# Patient Record
Sex: Male | Born: 1990 | Race: White | Hispanic: No | Marital: Single | Smoking: Former smoker
Health system: Southern US, Community
[De-identification: ages and names within clinical notes are randomized; demographics above are authoritative.]

## PROBLEM LIST (undated history)

## (undated) DIAGNOSIS — J45909 Unspecified asthma, uncomplicated: Secondary | ICD-10-CM

## (undated) HISTORY — DX: Unspecified asthma, uncomplicated: J45.909

---
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
## 2010-01-01 ENCOUNTER — Ambulatory Visit: Payer: Self-pay | Admitting: Internal Medicine

## 2010-01-01 DIAGNOSIS — J309 Allergic rhinitis, unspecified: Secondary | ICD-10-CM | POA: Insufficient documentation

## 2010-01-01 DIAGNOSIS — R220 Localized swelling, mass and lump, head: Secondary | ICD-10-CM | POA: Insufficient documentation

## 2010-01-01 DIAGNOSIS — R221 Localized swelling, mass and lump, neck: Secondary | ICD-10-CM

## 2010-01-01 DIAGNOSIS — Q892 Congenital malformations of other endocrine glands: Secondary | ICD-10-CM | POA: Insufficient documentation

## 2010-01-01 DIAGNOSIS — J45909 Unspecified asthma, uncomplicated: Secondary | ICD-10-CM | POA: Insufficient documentation

## 2010-04-07 ENCOUNTER — Telehealth: Payer: Self-pay | Admitting: Cardiology

## 2010-04-23 ENCOUNTER — Ambulatory Visit: Payer: Self-pay | Admitting: Cardiovascular Disease

## 2010-04-23 ENCOUNTER — Ambulatory Visit (HOSPITAL_COMMUNITY): Admission: RE | Admit: 2010-04-23 | Discharge: 2010-04-23 | Payer: Self-pay | Admitting: Cardiology

## 2010-04-23 ENCOUNTER — Encounter: Payer: Self-pay | Admitting: Cardiology

## 2010-04-23 ENCOUNTER — Ambulatory Visit: Payer: Self-pay

## 2010-04-27 ENCOUNTER — Encounter: Payer: Self-pay | Admitting: Cardiology

## 2010-04-28 ENCOUNTER — Encounter: Admission: RE | Admit: 2010-04-28 | Discharge: 2010-04-28 | Payer: Self-pay | Admitting: Oral Surgery

## 2010-04-29 ENCOUNTER — Encounter: Payer: Self-pay | Admitting: Cardiology

## 2010-04-30 ENCOUNTER — Ambulatory Visit: Payer: Self-pay | Admitting: Cardiology

## 2010-04-30 ENCOUNTER — Telehealth: Payer: Self-pay | Admitting: Cardiology

## 2010-05-20 ENCOUNTER — Ambulatory Visit: Payer: Self-pay | Admitting: Internal Medicine

## 2010-05-20 DIAGNOSIS — R042 Hemoptysis: Secondary | ICD-10-CM | POA: Insufficient documentation

## 2010-05-20 DIAGNOSIS — R9389 Abnormal findings on diagnostic imaging of other specified body structures: Secondary | ICD-10-CM | POA: Insufficient documentation

## 2010-05-21 ENCOUNTER — Encounter (INDEPENDENT_AMBULATORY_CARE_PROVIDER_SITE_OTHER): Payer: Self-pay | Admitting: *Deleted

## 2010-05-25 ENCOUNTER — Encounter: Payer: Self-pay | Admitting: Internal Medicine

## 2010-05-26 ENCOUNTER — Ambulatory Visit (HOSPITAL_COMMUNITY): Admission: RE | Admit: 2010-05-26 | Discharge: 2010-05-26 | Payer: Self-pay | Admitting: Internal Medicine

## 2010-05-26 ENCOUNTER — Ambulatory Visit: Payer: Self-pay | Admitting: Cardiology

## 2010-12-28 NOTE — Progress Notes (Signed)
Summary: clearance for surgery on 6/7   Phone Note From Other Clinic   Caller: kristen office [phone] Request: Talk with Nurse Summary of Call: from office notes pt need to be clear surgery on 6/7. pls forward ekg over as well.  Initial call taken by: Lorne Skeens,  April 30, 2010 3:40 PM  Follow-up for Phone Call        needs an ok for pt to have anesthesia faxed to [phone], willhave Dr Myrtis Ser include in his ov note and fax Meredith Staggers, RN  April 30, 2010 4:09 PM   NOTE FAXED Meredith Staggers, RN  April 30, 2010 5:25 PM

## 2010-12-28 NOTE — Assessment & Plan Note (Signed)
Summary: low ef   Visit Type:  Initial Consult Referring Provider:  Bensimhon Primary Provider:  Marga Melnick MD  CC:  no complaints -- just 2 weeks out of jaw surgery.  History of Present Illness: Bruce Foster is a 20 y/o male referred by Dr. Myrtis Ser for the evaluation of an abnormal echocardiogram.   He is very healthy.  He has just finished his first year of college.  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  He exercises regularly.  He has no chest pain or shortness of breath. He has not had syncope or presyncope.  He was scheduled to have surgery on his jaw.  As his mother had some cardiac complications when she gave birth and was told to have her kids echo'd at some point, they thus checked a pre-op  echo was ordered as baseline information by Dr.Hopper.   Dr. Myrtis Ser reviewed the echo with me. The EF is 50-55%  The right ventricle is somewhat dilated.  There may be mild decrease in right ventricular function.  In many views it appears that there are mild trabeculae in both the right ventricle and left ventricle may be prominent. Did not appear to be non-compaction.   Current Medications (verified): 1)  Ventolin Hfa 108 (90 Base) Mcg/act Aers (Albuterol Sulfate) .... 2 Puffs Every 4 Hours As Needed For Asthma 2)  Zyrtec Allergy 10 Mg Caps (Cetirizine Hcl) .Marland Kitchen.. 1 Tab Once Daily  Allergies (verified): 1)  ! Pcn 2)  ! * Sporins 3)  ! * Shellfish Allergy  Past History:  Past Medical History: Last updated: 04/30/2010 PCN, Cephalosporin  & sulfa allergies(? angioedema) Allergic rhinitis Asthma EF 45%-50%.... echo... Apr 23, 2010 (with review, EF 50-55%)..prominent trabeculae in the LV and RV Right ventricle... mild to moderate dilatation... echo... May 27... 2011..mild RV dysfunction Jaw surgery.... scheduled.. June, 2011  Past Surgical History: Last updated: 01/01/2010 Thyroglossal Duct Cystectomy as infant    Family History: Last updated: 05/20/2010 Father:  LIVING Mother: LIVING-CAD / MI, Hx of Skin Cancer Siblings:1 sister  MGF : blindness  ? Macular Degeneration  Social History: Last updated: 01/01/2010 Occupation: Student @ [employer] Single Current Smoker: recreational Alcohol use-yes: socially Regular exercise-yes  Risk Factors: Exercise: yes (01/01/2010)  Risk Factors: Smoking Status: current (01/01/2010)  Family History: Reviewed history from 01/01/2010 and no changes required. Father: LIVING Mother: LIVING-CAD / MI, Hx of Skin Cancer Siblings:1 sister  MGF : blindness  ? Macular Degeneration  Social History: Reviewed history from 01/01/2010 and no changes required. 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   Vital Signs:  Patient profile:   20 year old male Height:      71 inches Weight:      140 pounds BMI:     19.60 Pulse rate:   75 / minute BP sitting:   104 / 66  (left arm) Cuff size:   regular  Vitals Entered By: Hardin Negus, RMA (May 20, 2010 11:57 AM)  Physical Exam  General:  Young healthy well appearing. no resp difficulty HEENT: normal except for recent jaw surgery Neck: supple. no JVD. Carotids 2+ bilat; no bruits. No lymphadenopathy or thryomegaly appreciated. Cor: PMI nondisplaced. Bradycardic. regular rhythm. No rubs, gallops, murmur. Lungs: clear Abdomen: soft, nontender, nondistended. No hepatosplenomegaly. No bruits or masses. Good bowel sounds. Extremities: no cyanosis, clubbing,  rash, edema Neuro: alert & orientedx3, cranial nerves grossly intact. moves all 4 extremities w/o difficulty. affect pleasant    Impression & Recommendations:  Problem # 1:  ABNORMAL ECHOCARDIOGRAM (ICD-793.2) I suspect this is just a normal variant of athlete's heart. Will check cardiac MRI to further evaluate trabeculae and quantify EF. If  abnormal will need ETT and monitor.   Other Orders: Cardiac MRI (Cardiac MRI)  Patient Instructions: 1)  Your physician has requested that you have a cardiac MRI.  Cardiac MRI uses a computer to create images of your heart as it's beating, producing both still and moving pictures of your heart and major blood vessels. For further information please visit  [email or web address]  Please follow the instruction sheet given to you today for more information.

## 2010-12-28 NOTE — Progress Notes (Signed)
Summary: new pt appt  Phone Note Call from Patient Call back at Home Phone [phone]   Caller: Mom Reason for Call: Talk to Nurse Summary of Call: request Dr Myrtis Ser take him as a new pt, due to mother health hx, request appt asap, having jaw surgery in June, request to be seen before then Initial call taken by: Migdalia Dk,  Apr 07, 2010 5:04 PM  Follow-up for Phone Call        [facility] Dr Myrtis Ser will see this pt at 1st available, which looks like 5/30 thanks Meredith Staggers, RN  Apr 08, 2010 8:42 AM   lm on vm to call back and sch appt, Migdalia Dk  Apr 08, 2010 11:18 AM   pt is sch for 5/31, mother request pt have  echo prior to appt or before surgery on 6/7, Migdalia Dk  Apr 08, 2010 4:08 PM   Additional Follow-up for Phone Call Additional follow up Details #1::        Dr Myrtis Ser he is sch to see you 5/31 do you want him to have an echo or just wait until you see him??? Meredith Staggers, RN  Apr 14, 2010 2:48 PM     Additional Follow-up for Phone Call Additional follow up Details #2::    Echo ahead of time will be good.  Talitha Givens, MD, [facility]  Apr 15, 2010 10:30 AM  order for echo placed Meredith Staggers, RN  Apr 15, 2010 4:42 PM

## 2010-12-28 NOTE — Letter (Signed)
Summary: [employer]   [employer]   Imported By: Roderic Ovens 07/01/2010 14:52:24  _____________________________________________________________________  External Attachment:    Type:   Image     Comment:   External Document

## 2010-12-28 NOTE — Assessment & Plan Note (Signed)
Summary: np6   Visit Type:  Initial Consult Referring Provider:  Bensimhon Primary Provider:  Marga Melnick MD  CC:  abnormal echocardiogram.  History of Present Illness: The patient is referred for the evaluation of an abnormal echocardiogram.  Patient is quite healthy.  He has just finished his first year of college.  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  He exercises regularly.  He has no chest pain or shortness of breath.  He appears to be in excellent physical condition.  He has not had syncope or presyncope.  He is scheduled to have a long surgery on his jaw.  An echo was ordered as baseline information by Dr.Hopper. The patient was referred to fully discuss and evaluate the echo data.    I have personally reviewed the echo.  There are several beats that give  the impression that there may be some left ventricular dysfunction.  But, as I have reviewed the images repeatedly I believe that the ejection fraction is 50% or possibly as high as 55%.  This leaves the range at 50-55%.  The original reading was 45-50%.  The right ventricle is somewhat dilated.  There may be mild decrease in right ventricular function.  In many views it appears that the trabeculae in both the right ventricle and left ventricle may be prominent.  Current Medications (verified): 1)  Ventolin Hfa 108 (90 Base) Mcg/act Aers (Albuterol Sulfate) .... 2 Puffs Every 4 Hours As Needed For Asthma 2)  Zyrtec Allergy 10 Mg Caps (Cetirizine Hcl) .Marland Kitchen.. 1 Tab Once Daily  Allergies (verified): 1)  ! Pcn 2)  ! * Sporins 3)  ! * Shellfish Allergy  Past History:  Past Medical History: PCN, Cephalosporin  & sulfa allergies(? angioedema) Allergic rhinitis Asthma EF 45%-50%.... echo... Apr 23, 2010 (with review, EF 50-55%)..prominent trabeculae in the LV and RV Right ventricle... mild to moderate dilatation... echo... May 27... 2011..mild RV dysfunction Jaw surgery.... scheduled.. June, 2011  Review of  Systems       The patient denies fever, chills, headache, sweats, rash, change in vision, change in hearing, chest pain, cough, nausea vomiting, urinary symptoms.  All other systems are reviewed and are negative.  Vital Signs:  Patient profile:   20 year old male Height:      71 inches Weight:      150 pounds BMI:     21.00 Pulse rate:   50 / minute BP sitting:   112 / 65  (left arm)  Vitals Entered By: Burnett Kanaris, CNA (April 30, 2010 11:52 AM)  Physical Exam  General:  The patient is extremely healthy in appearance. Head:  head is atraumatic. Eyes:  no xanthelasma. Neck:  no jugular venous stent.  No carotid bruits. Chest Wall:  no chest wall tenderness. Lungs:  lungs are clear.  Respiratory effort is nonlabored. Heart:  cardiac exam reveals S1 and S2.  No clicks or significant murmurs. Abdomen:  abdomen is soft. Msk:  no musculoskeletal deformities. Extremities:  no peripheral edema. Skin:  there is a skin rash on the patient's right knee. Psych:  patient is oriented to person time and place.  Affect is normal.   Impression & Recommendations:  Problem # 1:  * RIGHT VENTRICULAR DILATATION I have reviewed the echo and there is some dilatation of the right ventricle.  There is prominent trabeculae in the right ventricle.  There is no significant tricuspid regurgitation.  Right heart pressure cannot be estimated well from  this study  Problem # 2:  * EF 45-50% (review 50-55%) There is slight  reduction in the ejection fraction. There are prominent trabeculae in the left ventricle.  These are not isolated to the apex.  EKG is done today and reviewed by me.  Patient has sinus bradycardia.  Axis is rightward.  I had a long and careful discussion with the patient about mild decrease in left ventricular function.  The etiology is not clear to me.  It is possible that all of the findings may be related to an athletic heart.  However  he is not currently exercising at a high level.  I've  chosen to refer him to Dr.Bensimhon for further assessment of all of the data.  I considered a cardiac MRI but chosen not to proceed with this until the patient is seen by Dr.Bensimhon.   Problem # 3:  ATHEROSCLEROTIC HEART DISEASE, FAMILY HX (ICD-V17.3) There is a family history of coronary disease.  There is no evidence of cardiac ischemia at this time.  Problem # 4:  ASTHMA (ICD-493.90)  His updated medication list for this problem includes:    Ventolin Hfa 108 (90 Base) Mcg/act Aers (Albuterol sulfate) .Marland Kitchen... 2 puffs every 4 hours as needed for asthma I do not think that the patient's asthma is playing a significant role at this time.  Problem # 5:  * PROMINENT TRABECULAE LV AND RV It is possible that this is a normal variant.  MRI may be appropriate to help with this question.  Overall the patient's cardiac status is stable.  He is clear for his jaw surgery and to go about his normal activities.  Problem # 6:  * CLEARANCE FOR JAW SURGERY Patient is to have jaw surgery.  He is cleared for this.  He is cleared to have general anesthesia for his surgery.  Patient Instructions: 1)  Your physician recommends that you schedule a follow-up appointment in: we will call you with an appointment date

## 2010-12-28 NOTE — Assessment & Plan Note (Signed)
Summary: Bruce Foster   Vital Signs:  Patient profile:   20 year old male Height:      71.25 inches Weight:      142 pounds BMI:     19.74 Temp:     97.6 degrees F oral Pulse rate:   72 / minute Resp:     14 per minute BP sitting:   110 / 68  (left arm) Cuff size:   large  Vitals Entered By: Shonna Chock (January 01, 2010 2:10 PM)  CC: New Patient establish: CPX and swollen lymph nodes on left side x couple weeks, General Medical Evaluation Comments REVIEWED MED LIST, PATIENT AGREED DOSE AND INSTRUCTION CORRECT    CC:  New Patient establish: CPX and swollen lymph nodes on left side x couple weeks and General Medical Evaluation.  History of Present Illness: Bruce Foster is here for an exam; he is concerned about a stable,painless  L neck  lymph node present  ? 3-4 weeks w/o specific trigger. He is to have elective Orthognathic Surgery in 03/2010.  Preventive Screening-Counseling & Management  Alcohol-Tobacco     Smoking Status: current  Caffeine-Diet-Exercise     Does Patient Exercise: yes  Allergies (verified): 1)  ! Pcn  Past History:  Past Medical History: PCN, Cephalosporin  & sulfa allergies(? angioedema) Allergic rhinitis Asthma  Past Surgical History: Thyroglossal Duct Cystectomy as infant    Family History: Father: LIVING Mother: LIVING-CAD / MI, Hx of Skin Cancer Siblings:1 sister  MGF : blindness  ? Macular Degeneration  Social History: Occupation: TEFL teacher Current Smoker: recreational Alcohol use-yes: socially Regular exercise-yes Smoking Status:  current Does Patient Exercise:  yes  Review of Systems General:  Denies chills, fatigue, fever, loss of appetite, malaise, sleep disorder, sweats, and weight loss. Eyes:  Denies blurring, double vision, and vision loss-both eyes. ENT:  Complains of nasal congestion and sinus pressure; denies difficulty swallowing, earache, and hoarseness; Perennial symptoms.No frontal headache , facial pain or  purulence. CV:  Denies chest pain or discomfort, palpitations, and shortness of breath with exertion. Resp:  Denies cough, shortness of breath, and sputum productive; Minor wheezing; rescue used rarely. GI:  Denies abdominal pain, constipation, diarrhea, indigestion, nausea, vomiting, and yellowish skin color. GU:  Denies discharge, dysuria, genital sores, and hematuria. MS:  Denies joint pain, joint redness, and joint swelling. Derm:  Denies changes in color of skin, lesion(s), and rash. Neuro:  Denies numbness and tingling. Psych:  Denies anxiety and depression. Endo:  Denies cold intolerance and heat intolerance. Heme:  Complains of enlarge lymph nodes; denies abnormal bruising, bleeding, and pallor. Allergy:  Denies itching eyes and sneezing.  Physical Exam  General:  Thin but well-nourished; alert,appropriate and cooperative throughout examination Head:  Normocephalic and atraumatic without obvious abnormalities. No apparent alopecia or balding. Eyes:  No corneal or conjunctival inflammation noted.Marland Kitchen Perrla. Funduscopic exam benign, without hemorrhages, exudates or papilledema. No lid lag Ears:  External ear exam shows no significant lesions or deformities.  Otoscopic examination reveals clear canals, tympanic membranes are intact bilaterally without bulging, retraction, inflammation or discharge. Hearing is grossly normal bilaterally. Nose:  External nasal examination shows no deformity or inflammation. Nasal mucosa  mildly erythematous with minimal  exudate Mouth:  Oral mucosa and oropharynx without lesions or exudates.  Teeth in good repair. Upper & lower braces. Relative mandibular micrognathia Neck:  No deformities, masses, or tenderness noted.? prominent external venous valve over well developed L SCM musculature Chest Wall:  Slight asymmetry  of pectoralis musculature Lungs:  Normal respiratory effort, chest expands symmetrically. Lungs are clear to auscultation, no crackles or  wheezes. Heart:  Normal rate and regular rhythm. S1 and S2 normal without gallop, murmur, click, rub. Loud S4 Abdomen:  Bowel sounds positive,abdomen soft and non-tender without masses, organomegaly or hernias noted. Rectal:  Deferred Genitalia:  Testes bilaterally descended without nodularity, tenderness or masses. No scrotal masses or lesions. No penis lesions or urethral discharge. Prostate:  deferred Msk:  ? minimal scoliosis suggested Pulses:  R and L carotid,radial,dorsalis pedis and posterior tibial pulses are full and equal bilaterally Extremities:  No clubbing, cyanosis, edema, or deformity noted with normal full range of motion of all joints.   Neurologic:  alert & oriented X3 and DTRs symmetrical and normal.   Skin:  Facial plethora Cervical Nodes:  No lymphadenopathy noted (see neck) Axillary Nodes:  No palpable lymphadenopathy Inguinal Nodes:  No significant adenopathy Psych:  memory intact for recent and remote, normally interactive, and good eye contact.     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 Neti pot once daily for any head congestion.Consider baseline fasting labs:BMP ;Hepatic Panel ;Lipid Panel ;TSH ;CBC w/ Diff. Report any changes in neck lesion or symptoms.

## 2010-12-28 NOTE — Letter (Signed)
Summary: Appointment - Cardiac MRI  [facility]     [place], [place]    Phone:   Fax:       May 21, 2010 MRN: [id number]   Bruce Foster [address] [place], [place]  [address]   Dear [name],   We have scheduled the above patient for an appointment for a Cardiac MRI on June 29,2011 at 8:00a.m.  Please refer to the below information for the location and instructions for this test:  Location:     [facility]       [address]       [place], [place]  [address] Instructions:    Wilmon Arms at [facility] Outpatient Registration 45 minutes prior to your appointment time.  This will ensure you are in the Radiology Department 30 minutes prior to your appointment.    There are no restrictions for this test you may eat and take medications as usual.  If you need to reschedule this appointment please call at the number listed above.   Sincerely,      Lorne Skeens  [facility] Scheduling Team

## 2010-12-28 NOTE — Miscellaneous (Signed)
  Clinical Lists Changes  Observations: Added new observation of PAST MED HX: PCN, Cephalosporin  & sulfa allergies(? angioedema) Allergic rhinitis Asthma EF 45%-50%.... echo... Apr 23, 2010 Right ventricle... mild to moderate dilatation... echo... May 27... 2011..mild RV dysfunction (04/27/2010 9:47)       Past History:  Past Medical History: PCN, Cephalosporin  & sulfa allergies(? angioedema) Allergic rhinitis Asthma EF 45%-50%.... echo... Apr 23, 2010 Right ventricle... mild to moderate dilatation... echo... May 27... 2011..mild RV dysfunction

## 2010-12-28 NOTE — Letter (Signed)
Summary: New Patient Letter  [facility] [place]  [address] [place], [place] [address]   Phone: [phone]  Fax: [phone]       12/16/2009 MRN: [id number]  Bruce Foster [address] [place], [place]  [address]  Dear [name],   Welcome to [facility] and thank you for choosing [place] as your Primary Care Providers. Enclosed you will find information about our practice that we hope you find helpful. We have also enclosed forms to be filled out prior to your visit. This will provide [place] with the necessary information and facilitate your being seen in a timely manner. If you have any questions, please call us at:  [phone]        and we will be happy to assist you. We look forward to seeing you at your scheduled appointment time.  Appointment   [name] [phone] @ 3:30PM            with Dr.    Alwyn Ren             Sincerely,  Primary Health Care Team  Please arrive 15 minutes early for your first appointment and bring your insurance card. Co-pay is required at the time of your visit.  *****Please call the office if you are not able to keep this appointment. There is a charge of $50.00 if any appointment is not cancelled or rescheduled within 24 hours.

## 2010-12-28 NOTE — Miscellaneous (Signed)
  Clinical Lists Changes  Problems: Added new problem of * EF 45-50% Added new problem of * RIGHT VENTRICULAR DILATATION Observations: Added new observation of PAST MED HX: PCN, Cephalosporin  & sulfa allergies(? angioedema) Allergic rhinitis Asthma EF 45%-50%.... echo... Apr 23, 2010 Right ventricle... mild to moderate dilatation... echo... May 27... 2011 (04/23/2010 15:12)       Past History:  Past Medical History: PCN, Cephalosporin  & sulfa allergies(? angioedema) Allergic rhinitis Asthma EF 45%-50%.... echo... Apr 23, 2010 Right ventricle... mild to moderate dilatation... echo... May 27... 2011

## 2010-12-28 NOTE — Miscellaneous (Signed)
  Clinical Lists Changes  Observations: Added new observation of ECHOINTERP:  - Left ventricle: The cavity size was normal. Systolic function was       mildly reduced. The estimated ejection fraction was in the range       of 45% to 50%.     - Right ventricle: The cavity size was mildly to moderately dilated.     - Atrial septum: No defect or patent foramen ovale was identified. (04/23/2010 10:16)      Echocardiogram  Procedure date:  04/23/2010  Findings:       - Left ventricle: The cavity size was normal. Systolic function was       mildly reduced. The estimated ejection fraction was in the range       of 45% to 50%.     - Right ventricle: The cavity size was mildly to moderately dilated.     - Atrial septum: No defect or patent foramen ovale was identified.

## 2011-02-13 LAB — CREATININE, SERUM
Creatinine, Ser: 0.81 mg/dL (ref 0.4–1.5)
GFR calc non Af Amer: >60 mL/min (ref >60)

## 2011-02-13 LAB — BUN: BUN: 14 mg/dL (ref 6–23)

## 2011-05-23 ENCOUNTER — Ambulatory Visit: Payer: Self-pay | Admitting: Internal Medicine

## 2012-01-06 IMAGING — CR DG CHEST 2V
2 series · 2 of 2 positions shown · non-contrast
Comparison: None.

CLINICAL DATA: Preop.

CHEST - 2 VIEW

[view not recorded (1 of 2)]
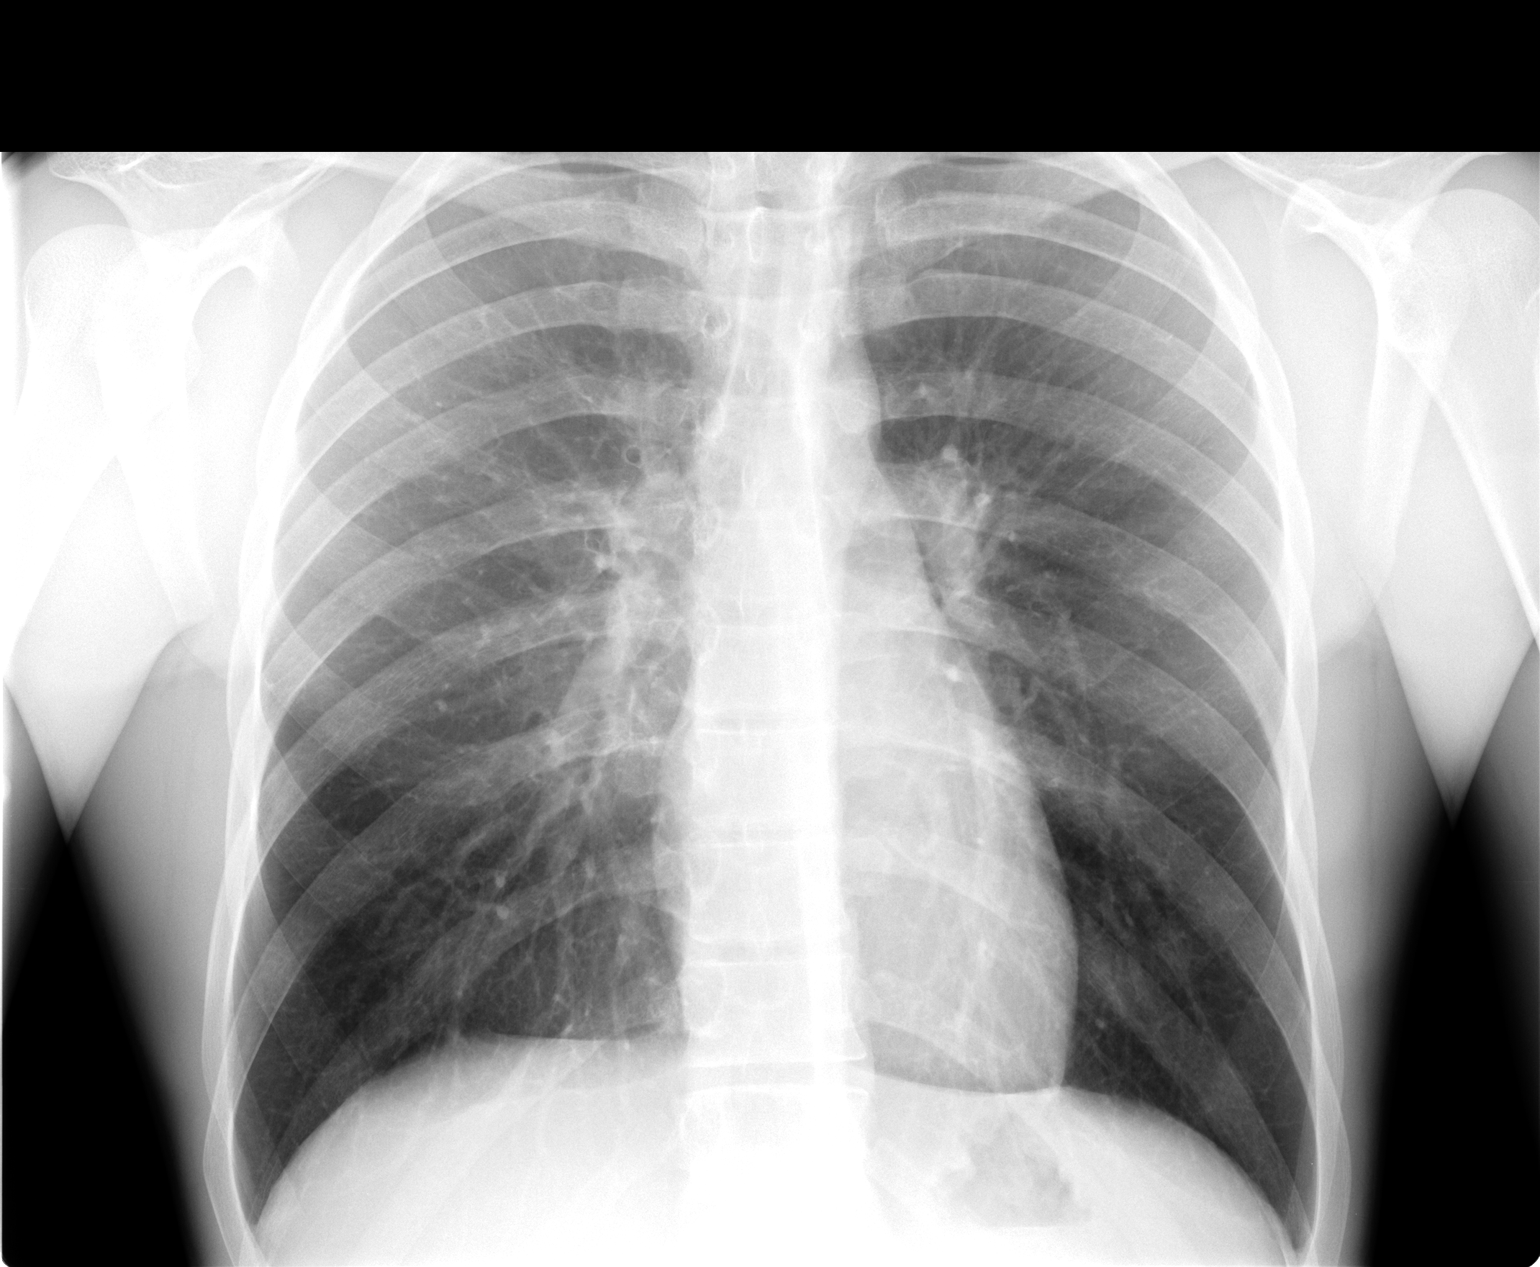

[view not recorded (2 of 2)]
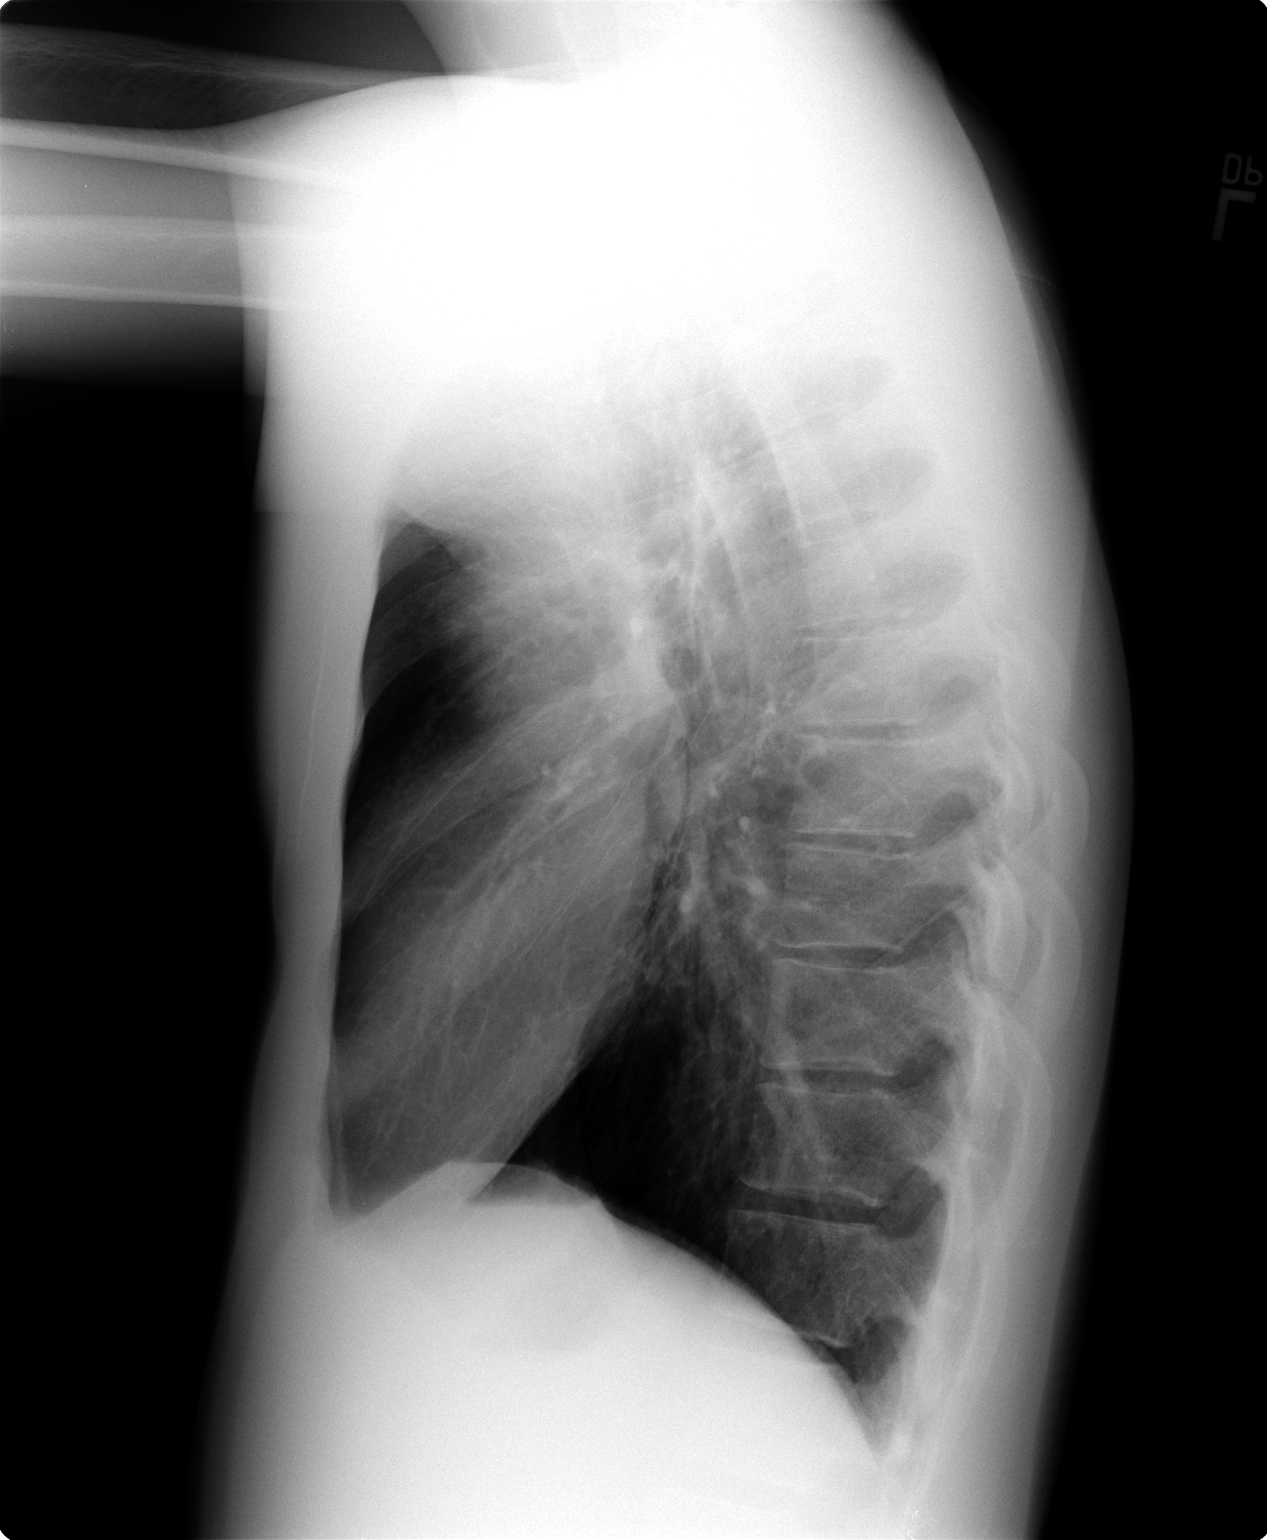

[2 of 2 positions shown; findings below may reference images not displayed]

FINDINGS: Trachea is midline.  Heart size normal.  Lungs are clear.
No pleural fluid.
IMPRESSION: No acute findings.

## 2012-02-03 ENCOUNTER — Other Ambulatory Visit: Payer: Self-pay | Admitting: Internal Medicine

## 2012-02-03 NOTE — Telephone Encounter (Signed)
Ok for 1 inhaler, no refills.  Pt must make appt

## 2012-02-03 NOTE — Telephone Encounter (Signed)
Last and only OV 01/01/2010, please advise.

## 2012-02-03 NOTE — Telephone Encounter (Signed)
Patient needs to schedule a CPX  

## 2012-08-30 ENCOUNTER — Ambulatory Visit: Payer: Self-pay | Admitting: Family Medicine

## 2013-03-19 ENCOUNTER — Ambulatory Visit: Payer: Self-pay | Admitting: Internal Medicine

## 2013-04-12 ENCOUNTER — Encounter: Payer: Self-pay | Admitting: General Practice

## 2013-04-12 ENCOUNTER — Other Ambulatory Visit: Payer: Self-pay | Admitting: Internal Medicine

## 2013-04-12 NOTE — Telephone Encounter (Signed)
Medication denied. Pt has not been seen by anyone in this office since 2011. Pt advised on last refill that he needs a CPE. Letter mailed to pt informing again.

## 2013-05-21 ENCOUNTER — Other Ambulatory Visit (HOSPITAL_BASED_OUTPATIENT_CLINIC_OR_DEPARTMENT_OTHER): Payer: Self-pay | Admitting: Internal Medicine

## 2013-05-21 ENCOUNTER — Ambulatory Visit (HOSPITAL_BASED_OUTPATIENT_CLINIC_OR_DEPARTMENT_OTHER)
Admission: RE | Admit: 2013-05-21 | Discharge: 2013-05-21 | Disposition: A | Discharge status: Home / Self Care | Payer: Self-pay | Source: Ambulatory Visit | Attending: Internal Medicine | Admitting: Internal Medicine

## 2013-05-21 DIAGNOSIS — M545 Low back pain, unspecified: Secondary | ICD-10-CM

## 2013-05-21 DIAGNOSIS — M5410 Radiculopathy, site unspecified: Secondary | ICD-10-CM

## 2014-11-04 ENCOUNTER — Ambulatory Visit: Payer: Self-pay | Admitting: Family

## 2014-11-11 ENCOUNTER — Encounter: Payer: Self-pay | Admitting: Family

## 2014-11-11 ENCOUNTER — Ambulatory Visit (INDEPENDENT_AMBULATORY_CARE_PROVIDER_SITE_OTHER): Payer: Managed Care, Other (non HMO) | Admitting: Family

## 2014-11-11 ENCOUNTER — Telehealth: Payer: Self-pay | Admitting: Family

## 2014-11-11 ENCOUNTER — Other Ambulatory Visit (INDEPENDENT_AMBULATORY_CARE_PROVIDER_SITE_OTHER): Payer: Managed Care, Other (non HMO)

## 2014-11-11 DIAGNOSIS — J392 Other diseases of pharynx: Secondary | ICD-10-CM

## 2014-11-11 DIAGNOSIS — R220 Localized swelling, mass and lump, head: Secondary | ICD-10-CM

## 2014-11-11 DIAGNOSIS — R221 Localized swelling, mass and lump, neck: Secondary | ICD-10-CM

## 2014-11-11 DIAGNOSIS — R0989 Other specified symptoms and signs involving the circulatory and respiratory systems: Secondary | ICD-10-CM | POA: Insufficient documentation

## 2014-11-11 LAB — CBC WITH DIFFERENTIAL/PLATELET
Basophils Absolute: 0 10*3/uL (ref 0.0–0.1)
Basophils Relative: 0.4 % (ref 0.0–3.0)
EOS ABS: 0.3 10*3/uL (ref 0.0–0.7)
EOS PCT: 3.8 % (ref 0.0–5.0)
HEMATOCRIT: 48.2 % (ref 39.0–52.0)
Hemoglobin: 16.4 g/dL (ref 13.0–17.0)
LYMPHS ABS: 2 10*3/uL (ref 0.7–4.0)
Lymphocytes Relative: 23.7 % (ref 12.0–46.0)
MCHC: 34 g/dL (ref 30.0–36.0)
MCV: 94 fl (ref 78.0–100.0)
MONO ABS: 0.7 10*3/uL (ref 0.1–1.0)
Monocytes Relative: 8.8 % (ref 3.0–12.0)
NEUTROS PCT: 63.3 % (ref 43.0–77.0)
Neutro Abs: 5.3 10*3/uL (ref 1.4–7.7)
PLATELETS: 242 10*3/uL (ref 150.0–400.0)
RBC: 5.13 Mil/uL (ref 4.22–5.81)
RDW: 13.3 % (ref 11.5–15.5)
WBC: 8.3 10*3/uL (ref 4.0–10.5)

## 2014-11-11 MED ORDER — EPINEPHRINE 0.3 MG/0.3ML IJ SOAJ: 0.3000 mg | Freq: Once | INTRAMUSCULAR | Status: AC

## 2014-11-11 NOTE — Progress Notes (Signed)
Pre visit review using our clinic review tool, if applicable. No additional management support is needed unless otherwise documented below in the visit note. 

## 2014-11-11 NOTE — Progress Notes (Signed)
Subjective:    Patient ID: Bruce Foster, male    DOB: 03/02/1991, 23 y.o.   MRN: [id number]  Chief Complaint  Patient presents with  . Establish Care    wants a check up    HPI:  Bruce Foster is a 23 y.o. male who presents today    1) Neck - Acute symptoms started about 4-5 months ago and was seen at urgent care and was told that it would probably go away. Lump is located on the left lateral neck. Denies any changes in size. Denies any treatments attempted for it. Denies any changes to swallowing or breathing. Describes no pain. There is nothing that makes it better or worse.  2) Tonsils - Acute symptoms started a couple of days ago and was noted to have a swollen tonsil on the right side. Denies any fevers, sore throat or cold like symptoms. Has tried hot tea which had no effect on it. Denies anything that makes it better or worse.   Allergies  Allergen Reactions  . Cephalosporins   . Penicillins    Current Outpatient Prescriptions on File Prior to Visit  Medication Sig Dispense Refill  . VENTOLIN HFA 108 (90 BASE) MCG/ACT inhaler 2 PUFFS EVERY 4 HOURS IF NEEDED FOR ASTHMA. 8.5 each 0   No current facility-administered medications on file prior to visit.   Past Medical History  Diagnosis Date  . Asthma    Family History  Problem Relation Age of Onset  . Heart disease Mother    History   Social History  . Marital Status: Single    Spouse Name: N/A    Number of Children: 0  . Years of Education: 16   Occupational History  . Unemployed    Social History Main Topics  . Smoking status: Current Some Day Smoker    Types: Cigars  . Smokeless tobacco: Never Used  . Alcohol Use: 12.6 oz/week    0 Not specified, 21 Cans of beer per week  . Drug Use: No  . Sexual Activity: Not on file   Other Topics Concern  . Not on file   Social History Narrative   Born and raised in [place] and then raised mainly in [place]. Currently lives in an apartment by himself. No pets.  Fun: Basketball, running, mountain biking   Denies religious beliefs effecting health care.     Review of Systems    See HPI  Objective:    BP 124/82 mmHg  Pulse 61  Temp(Src) 98.4 F (36.9 C) (Oral)  Resp 18  Ht 5\' 11"  (1.803 m)  Wt 151 lb 6.4 oz (68.675 kg)  BMI 21.13 kg/m2  SpO2 97% Nursing note and vital signs reviewed.  Physical Exam  Constitutional: He is oriented to person, place, and time. He appears well-developed and well-nourished. No distress.  HENT:  Tonsils are 1+ bilaterally with no evidence of exudate present.  Neck:  No obvious discoloration of the neck noted. Mild edema of left lateral neck noted. No tenderness elicited upon palpation. Mobile and soft mass noted on left lateral aspect of neck.  Cardiovascular: Normal rate, regular rhythm, normal heart sounds and intact distal pulses.   Pulmonary/Chest: Effort normal and breath sounds normal.  Lymphadenopathy:    He has no cervical adenopathy.  Neurological: He is alert and oriented to person, place, and time.  Skin: Skin is warm and dry.  Psychiatric: He has a normal mood and affect. His behavior is normal. Judgment and thought  content normal.       Assessment & Plan:

## 2014-11-11 NOTE — Telephone Encounter (Signed)
Please call the patient and inform him that his complete blood count was normal with no evidence of infection. Therefore, we will continue with the ultrasound of his neck.

## 2014-11-11 NOTE — Assessment & Plan Note (Signed)
No obvious cause for tonsil pain. Continue to monitor at this time. Discussed over-the-counter and supportive care measures with the patient. Follow up if symptoms worsen or fail to improve.

## 2014-11-11 NOTE — Patient Instructions (Addendum)
Thank you for choosing [facility].  Summary/Instructions:  Referrals have been made during this visit. You should expect to hear back from our schedulers in about 7-10 days in regards to establishing an appointment with the specialists we discussed.   If your symptoms worsen or fail to improve, please contact our office for further instruction, or in case of emergency go directly to the emergency room at the closest medical facility.     

## 2014-11-11 NOTE — Assessment & Plan Note (Addendum)
Continues to note a mass in left neck. 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 Obtain ultrasound of neck. Obtain CBC to rule out infectious processes. Follow up pending ultrasound results.

## 2014-11-12 NOTE — Telephone Encounter (Signed)
Called and let pt know that all results were normal and we will continue with the ultrasound of his neck

## 2014-11-17 ENCOUNTER — Ambulatory Visit
Admission: RE | Admit: 2014-11-17 | Discharge: 2014-11-17 | Disposition: A | Discharge status: Home / Self Care | Payer: Managed Care, Other (non HMO) | Source: Ambulatory Visit | Attending: Family | Admitting: Family

## 2014-11-17 DIAGNOSIS — R220 Localized swelling, mass and lump, head: Secondary | ICD-10-CM

## 2014-11-17 DIAGNOSIS — R221 Localized swelling, mass and lump, neck: Principal | ICD-10-CM

## 2014-11-18 ENCOUNTER — Telehealth: Payer: Self-pay | Admitting: Family

## 2014-11-18 NOTE — Telephone Encounter (Signed)
Pt aware.

## 2014-11-18 NOTE — Telephone Encounter (Signed)
Please call the patient and inform him that his ultrasound results showed a lesion that does not have a blood supply, and it is located in an area of subcutaneous fat. 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 We will continue to monitor at this time and if you notice any significant changes we can pursue further evaluation.

## 2015-06-30 ENCOUNTER — Telehealth: Payer: Self-pay | Admitting: *Deleted

## 2015-06-30 ENCOUNTER — Other Ambulatory Visit: Payer: Self-pay | Admitting: *Deleted

## 2015-06-30 MED ORDER — ALBUTEROL SULFATE HFA 108 (90 BASE) MCG/ACT IN AERS: INHALATION_SPRAY | RESPIRATORY_TRACT | Status: DC

## 2015-06-30 NOTE — Telephone Encounter (Signed)
Patient request refill for albuterol inhaler he only has 8 puffs left.. [facility] [place] [address] Patient can be reached at [phone]

## 2015-06-30 NOTE — Telephone Encounter (Signed)
Received call pt wanting refill on his albuterol inhaler. Verified pharmacy [facility] [place].../lmb

## 2015-06-30 NOTE — Telephone Encounter (Signed)
I don't see that we have seen this pt or Rxd any medications for him (at least since on EPIC in 2013). I called back and LMOM advising this and suggesting he maybe called the wrong Urgent Care, and that he could call back if he has any further questions.

## 2016-01-16 ENCOUNTER — Emergency Department (HOSPITAL_COMMUNITY)
Admission: EM | Admit: 2016-01-16 | Discharge: 2016-01-17 | Disposition: A | Discharge status: Home / Self Care | Payer: BLUE CROSS/BLUE SHIELD | Attending: Emergency Medicine | Admitting: Emergency Medicine

## 2016-01-16 ENCOUNTER — Encounter (HOSPITAL_COMMUNITY): Payer: Self-pay | Admitting: *Deleted

## 2016-01-16 DIAGNOSIS — Z880 Allergy status to penicillin: Secondary | ICD-10-CM | POA: Diagnosis not present

## 2016-01-16 DIAGNOSIS — Y998 Other external cause status: Secondary | ICD-10-CM | POA: Insufficient documentation

## 2016-01-16 DIAGNOSIS — Y9389 Activity, other specified: Secondary | ICD-10-CM | POA: Insufficient documentation

## 2016-01-16 DIAGNOSIS — F17210 Nicotine dependence, cigarettes, uncomplicated: Secondary | ICD-10-CM | POA: Insufficient documentation

## 2016-01-16 DIAGNOSIS — S61210A Laceration without foreign body of right index finger without damage to nail, initial encounter: Secondary | ICD-10-CM | POA: Diagnosis not present

## 2016-01-16 DIAGNOSIS — W268XXA Contact with other sharp object(s), not elsewhere classified, initial encounter: Secondary | ICD-10-CM | POA: Diagnosis not present

## 2016-01-16 DIAGNOSIS — J45909 Unspecified asthma, uncomplicated: Secondary | ICD-10-CM | POA: Diagnosis not present

## 2016-01-16 DIAGNOSIS — Z23 Encounter for immunization: Secondary | ICD-10-CM | POA: Diagnosis not present

## 2016-01-16 DIAGNOSIS — Y9289 Other specified places as the place of occurrence of the external cause: Secondary | ICD-10-CM | POA: Diagnosis not present

## 2016-01-16 DIAGNOSIS — Z79899 Other long term (current) drug therapy: Secondary | ICD-10-CM | POA: Diagnosis not present

## 2016-01-16 DIAGNOSIS — S61219A Laceration without foreign body of unspecified finger without damage to nail, initial encounter: Secondary | ICD-10-CM

## 2016-01-16 NOTE — ED Notes (Signed)
Pt was throwing a ninja star and it cut his right index finger,  Wound has been cleaned and bleeding is controlled,  Pt has been drinking tonight,

## 2016-01-17 MED ORDER — ONDANSETRON 8 MG PO TBDP
8.0000 mg | ORAL_TABLET | Freq: Once | ORAL | Status: AC
  Administered 2016-01-17: 8 mg via ORAL
  Filled 2016-01-17: qty 1

## 2016-01-17 MED ORDER — TETANUS-DIPHTH-ACELL PERTUSSIS 5-2.5-18.5 LF-MCG/0.5 IM SUSP
0.5000 mL | Freq: Once | INTRAMUSCULAR | Status: AC
  Administered 2016-01-17: 0.5 mL via INTRAMUSCULAR
  Filled 2016-01-17: qty 0.5

## 2016-01-17 MED ORDER — LIDOCAINE HCL 1 % IJ SOLN
INTRAMUSCULAR | Status: AC
  Administered 2016-01-17: 20 mL
  Filled 2016-01-17: qty 20

## 2016-01-17 NOTE — Discharge Instructions (Signed)
Laceration Care, Adult  A laceration is a cut that goes through all layers of the skin. The cut also goes into the tissue that is right under the skin. Some cuts heal on their own. Others need to be closed with stitches (sutures), staples, skin adhesive strips, or wound glue. Taking care of your cut lowers your risk of infection and helps your cut to heal better.  HOW TO TAKE CARE OF YOUR CUT  For stitches or staples:  · Keep the wound clean and dry.  · If you were given a bandage (dressing), you should change it at least one time per day or as told by your doctor. You should also change it if it gets wet or dirty.  · Keep the wound completely dry for the first 24 hours or as told by your doctor. After that time, you may take a shower or a bath. However, make sure that the wound is not soaked in water until after the stitches or staples have been removed.  · Clean the wound one time each day or as told by your doctor:    Wash the wound with soap and water.    Rinse the wound with water until all of the soap comes off.    Pat the wound dry with a clean towel. Do not rub the wound.  · After you clean the wound, put a thin layer of antibiotic ointment on it as told by your doctor. This ointment:    Helps to prevent infection.    Keeps the bandage from sticking to the wound.  · Have your stitches or staples removed as told by your doctor.  If your doctor used skin adhesive strips:   · Keep the wound clean and dry.  · If you were given a bandage, you should change it at least one time per day or as told by your doctor. You should also change it if it gets dirty or wet.  · Do not get the skin adhesive strips wet. You can take a shower or a bath, but be careful to keep the wound dry.  · If the wound gets wet, pat it dry with a clean towel. Do not rub the wound.  · Skin adhesive strips fall off on their own. You can trim the strips as the wound heals. Do not remove any strips that are still stuck to the wound. They will  fall off after a while.  If your doctor used wound glue:  · Try to keep your wound dry, but you may briefly wet it in the shower or bath. Do not soak the wound in water, such as by swimming.  · After you take a shower or a bath, gently pat the wound dry with a clean towel. Do not rub the wound.  · Do not do any activities that will make you really sweaty until the skin glue has fallen off on its own.  · Do not apply liquid, cream, or ointment medicine to your wound while the skin glue is still on.  · If you were given a bandage, you should change it at least one time per day or as told by your doctor. You should also change it if it gets dirty or wet.  · If a bandage is placed over the wound, do not let the tape for the bandage touch the skin glue.  · Do not pick at the glue. The skin glue usually stays on for 5-10 days. Then, it   falls off of the skin.  General Instructions   · To help prevent scarring, make sure to cover your wound with sunscreen whenever you are outside after stitches are removed, after adhesive strips are removed, or when wound glue stays in place and the wound is healed. Make sure to wear a sunscreen of at least 30 SPF.  · Take over-the-counter and prescription medicines only as told by your doctor.  · If you were given antibiotic medicine or ointment, take or apply it as told by your doctor. Do not stop using the antibiotic even if your wound is getting better.  · Do not scratch or pick at the wound.  · Keep all follow-up visits as told by your doctor. This is important.  · Check your wound every day for signs of infection. Watch for:    Redness, swelling, or pain.    Fluid, blood, or pus.  · Raise (elevate) the injured area above the level of your heart while you are sitting or lying down, if possible.  GET HELP IF:  · You got a tetanus shot and you have any of these problems at the injection site:    Swelling.    Very bad pain.    Redness.    Bleeding.  · You have a fever.  · A wound that was  closed breaks open.  · You notice a bad smell coming from your wound or your bandage.  · You notice something coming out of the wound, such as wood or glass.  · Medicine does not help your pain.  · You have more redness, swelling, or pain at the site of your wound.  · You have fluid, blood, or pus coming from your wound.  · You notice a change in the color of your skin near your wound.  · You need to change the bandage often because fluid, blood, or pus is coming from the wound.  · You start to have a new rash.  · You start to have numbness around the wound.  GET HELP RIGHT AWAY IF:  · You have very bad swelling around the wound.  · Your pain suddenly gets worse and is very bad.  · You notice painful lumps near the wound or on skin that is anywhere on your body.  · You have a red streak going away from your wound.  · The wound is on your hand or foot and you cannot move a finger or toe like you usually can.  · The wound is on your hand or foot and you notice that your fingers or toes look pale or bluish.     This information is not intended to replace advice given to you by your health care provider. Make sure you discuss any questions you have with your health care provider.     Document Released: 05/02/2008 Document Revised: 03/31/2015 Document Reviewed: 11/10/2014  Elsevier Interactive Patient Education ©2016 [employer]

## 2016-01-17 NOTE — ED Notes (Signed)
Patient d/c'd self care.  F/U reviewed.  Patient verbalized understanding. 

## 2016-01-17 NOTE — ED Provider Notes (Signed)
CSN: [id number]     Arrival date & time 01/16/16  2257 History   First MD Initiated Contact with Patient 01/16/16 2351     Chief Complaint  Patient presents with  . Extremity Laceration     (Consider location/radiation/quality/duration/timing/severity/associated sxs/prior Treatment) HPI Comments: Laceration to right index finger caused by knife. No other injury. Tetanus is out of date.   The history is provided by the patient. No language interpreter was used.    Past Medical History  Diagnosis Date  . Asthma    Past Surgical History  Procedure Laterality Date  . Double jaw surgery  2010   Family History  Problem Relation Age of Onset  . Heart disease Mother    Social History  Substance Use Topics  . Smoking status: Current Some Day Smoker    Types: Cigars  . Smokeless tobacco: Never Used  . Alcohol Use: 12.6 oz/week    0 Standard drinks or equivalent, 21 Cans of beer per week    Review of Systems  Musculoskeletal:       Right index finger injury.  Skin: Positive for wound.  Neurological: Negative for weakness and numbness.      Allergies  Cephalosporins and Penicillins  Home Medications   Prior to Admission medications   Medication Sig Start Date End Date Taking? Authorizing Provider  albuterol (VENTOLIN HFA) 108 (90 BASE) MCG/ACT inhaler 2 PUFFS EVERY 4 HOURS IF NEEDED FOR ASTHMA. 06/30/15   Veryl Speak, FNP  EPINEPHrine 0.3 mg/0.3 mL IJ SOAJ injection Inject 0.3 mLs (0.3 mg total) into the muscle once. 11/11/14   Veryl Speak, FNP   There were no vitals taken for this visit. Physical Exam  Constitutional: He is oriented to person, place, and time. He appears well-developed and well-nourished.  Neck: Normal range of motion.  Pulmonary/Chest: Effort normal.  Musculoskeletal: Normal range of motion.  FROM all joints of finger. No tendon deficits.   Neurological: He is alert and oriented to person, place, and time.  Skin: Skin is warm and dry.  3  cm laceration palmar index finger overlying MC joint. No tendon exposure.  Psychiatric: He has a normal mood and affect.    ED Course  Procedures (including critical care time) Labs Review Labs Reviewed - No data to display  Imaging Review No results found. I have personally reviewed and evaluated these images and lab results as part of my medical decision-making.   EKG Interpretation None     LACERATION REPAIR Performed by: Elpidio Anis A Authorized by: Elpidio Anis A Consent: Verbal consent obtained. Risks and benefits: risks, benefits and alternatives were discussed Consent given by: patient Patient identity confirmed: provided demographic data Prepped and Draped in normal sterile fashion Wound explored  Laceration Location: right index finger  Laceration Length: 3 cm  No Foreign Bodies seen or palpated  Anesthesia: local infiltration  Local anesthetic: lidocaine 1% w/o epinephrine  Anesthetic total: 2 ml  Irrigation method: syringe Amount of cleaning: standard  Skin closure: 4-0 prolene  Number of sutures: 5  Technique: simple interrupted  Patient tolerance: Patient tolerated the procedure well with no immediate complications.  MDM   Final diagnoses:  None    1. Right index finger laceration  Simple laceration right finger, no tendon involvement.     Elpidio Anis, PA-C 01/17/16 [id number]  April Palumbo, MD 01/17/16 [phone]

## 2016-03-21 ENCOUNTER — Telehealth: Payer: Self-pay | Admitting: Family

## 2016-03-21 NOTE — Telephone Encounter (Signed)
 [facility] - Client TELEPHONE ADVICE RECORD [facility]  Patient Name: Bruce CahillMICHAEL Foster  DOB: 09/06/1991    Initial Comment Caller states he has been having pain right below sternum, to the left. Not sure if it is heart pain.    Nurse Assessment  Nurse: Laural BenesJohnson, RN, Dondra SpryGail Date/Time Bruce Foster(Eastern Time): 03/21/2016 4:37:38 PM  Confirm and document reason for call. If symptomatic, describe symptoms. You must click the next button to save text entered. ---Bruce Foster is having pain right below sternum -- when takes deep breath it hurts and tight -- onset Friday  Has the patient traveled out of the country within the last 30 days? ---No  Does the patient have any new or worsening symptoms? ---Yes  Will a triage be completed? ---Yes  Related visit to physician within the last 2 weeks? ---No  Does the PT have any chronic conditions? (i.e. diabetes, asthma, etc.) ---No  Is this a behavioral health or substance abuse call? ---No     Guidelines    Guideline Title Affirmed Question Affirmed Notes  Chest Pain [1] Chest pain lasting <= 5 minutes AND [2] NO chest pain or cardiac symptoms now (Exceptions: pains lasting a few seconds)    Final Disposition User   See Physician within 24 Hours [place] Valley[place], RN, Dondra SpryGail    Comments  Was attempting to make appt for tomorrow when he advised he will keep his appt on Wednesday -- unbeknownst to Nurse he had an appt. Nurse will document the information provided by him   Referrals  REFERRED TO PCP OFFICE   Disagree/Comply: Comply

## 2016-03-22 NOTE — Telephone Encounter (Signed)
Pt has an appointment for 03/23/16.

## 2016-03-23 ENCOUNTER — Encounter: Payer: Self-pay | Admitting: Family

## 2016-03-23 ENCOUNTER — Ambulatory Visit (INDEPENDENT_AMBULATORY_CARE_PROVIDER_SITE_OTHER)
Admission: RE | Admit: 2016-03-23 | Discharge: 2016-03-23 | Disposition: A | Discharge status: Home / Self Care | Payer: BLUE CROSS/BLUE SHIELD | Source: Ambulatory Visit | Attending: Family | Admitting: Family

## 2016-03-23 ENCOUNTER — Ambulatory Visit (INDEPENDENT_AMBULATORY_CARE_PROVIDER_SITE_OTHER): Payer: BLUE CROSS/BLUE SHIELD | Admitting: Family

## 2016-03-23 VITALS — BP 126/84 | HR 59 | Temp 98.1°F | Ht 71.0 in | Wt 154.0 lb

## 2016-03-23 DIAGNOSIS — R079 Chest pain, unspecified: Secondary | ICD-10-CM | POA: Diagnosis not present

## 2016-03-23 DIAGNOSIS — R0789 Other chest pain: Secondary | ICD-10-CM

## 2016-03-23 NOTE — Progress Notes (Signed)
Subjective:    Patient ID: Bruce Foster, male    DOB: January 16, 1991, 25 y.o.   MRN: [id number]   Bruce Foster is a 25 y.o. male who presents today for an acute visit.    HPI Comments: Patient presents for evaluation of sternum pain for one week Has improved. Occasionally with deep breath will radiate to the back. Describes pain as an ache/soreness. Pain started suddenly, felt like it was swelling. Comes and goes. Sore to touch. Pain is worse when pressing on sternum or laying on back. Improves with warm showers. Tried Bengay without relief.   Doesn't recall pulling a muscle however does not playing kickball league, running. Denies exertional chest pain or pressure, numbness or tingling radiating to left arm or jaw, palpitations, dizziness, frequent headaches, changes in vision, or shortness of breath.   No SCD in family or personal cardiac history.   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     Past Medical History  Diagnosis Date  . Asthma    Cephalosporins and Penicillins Current Outpatient Prescriptions on File Prior to Visit  Medication Sig Dispense Refill  . albuterol (VENTOLIN HFA) 108 (90 BASE) MCG/ACT inhaler 2 PUFFS EVERY 4 HOURS IF NEEDED FOR ASTHMA. 8.5 each 0  . EPINEPHrine 0.3 mg/0.3 mL IJ SOAJ injection Inject 0.3 mLs (0.3 mg total) into the muscle once. 1 Device 0   No current facility-administered medications on file prior to visit.    Social History  Substance Use Topics  . Smoking status: Current Some Day Smoker    Types: Cigars  . Smokeless tobacco: Never Used  . Alcohol Use: 12.6 oz/week    0 Standard drinks or equivalent, 21 Cans of beer per week    Review of Systems  Constitutional: Negative for fever and chills.  HENT: Positive for congestion (recent cold, resolved). Negative for ear pain, rhinorrhea, sinus pressure and sore throat.   Respiratory: Positive for cough (recent cold, resolved) and chest tightness (when  pain is present). Negative for shortness of breath and wheezing.   Cardiovascular: Negative for chest pain and palpitations.  Gastrointestinal: Negative for nausea, vomiting and diarrhea.  Musculoskeletal: Negative for myalgias.  Neurological: Negative for dizziness, syncope and headaches.  Psychiatric/Behavioral: Negative for confusion.      Objective:    BP 126/84 mmHg  Pulse 59  Temp(Src) 98.1 F (36.7 C) (Oral)  Ht  (1.803 m)  Wt 154 lb (69.854 kg)  BMI 21.49 kg/m2  SpO2 97%   Physical Exam  Constitutional: He appears well-developed and well-nourished.  Cardiovascular: Regular rhythm, normal heart sounds and normal pulses.  Exam reveals no gallop.   No murmur heard.   PMI is not displaced.  Reproducible chest wall pain below the Xiphoid process. No swelling appreciated. Mild discomfort noted in the same area with deep inspiration.  Pulmonary/Chest: Effort normal and breath sounds normal. No respiratory distress. He has no wheezes. He has no rhonchi. He has no rales.  Asked patient to cough, did not appreciate any bulges to suggest epigastric hernia.  Abdominal: Soft. Normal appearance and bowel sounds are normal. He exhibits no distension, no fluid wave, no ascites and no mass. There is no tenderness. There is no rigidity and no guarding.  Lymphadenopathy:       Head (left side): No submandibular and no preauricular adenopathy present.  Neurological: He is alert.  Skin: Skin is warm and dry.  Psychiatric: He has a  normal mood and affect. His speech is normal and behavior is normal.  Vitals reviewed.      Assessment & Plan:   1. Sternum pain Working diagnosis of costochondritis supported by reproducible chest wall pain. Reassured that symptom is improving and responds to warm shower further supporting musculoskeletal etiology.   Patient does not have any worrisome features to suggest cardiac etiology at this time. No hernia appreciated. I spent a great length of  time discussing with patient the reassuring features of his symptom as well as worrisome features that would suggest cardiac ( though he has none). In my chart review, I was unable to open cardiology notes therefore unable to see the workup he had done in 2011.   He appreciated and understood the education about costochondritis. He preferred to have a chest x-ray to look at his sternum and I obliged. I emphasized the importance if symptoms were to change or any cardiac symptoms present, we would proceed with a cardiac evaluation and referral. Patient agreed with this plan.  - DG Chest 2 View; Future   I am having Bruce Foster maintain his EPINEPHrine and albuterol.   No orders of the defined types were placed in this encounter.    Start medications as prescribed and explained to patient on After Visit Summary ( AVS). Risks, benefits, and alternatives of the medications and treatment plan prescribed today were discussed, and patient expressed understanding.   Education regarding symptom management and diagnosis given to patient.   Follow-up:Plan follow-up as discussed or as needed if any worsening symptoms or change in condition. No Follow-up on file.   Continue to follow with Jeanine Luzalone, Gregory, FNP for routine health maintenance.   Bruce Foster and I agreed with plan.   Bruce PlowmanMargaret Camelia Stelzner, FNP  15 minutes spent with patient today. >50% counseling patient regarding diagnosis.

## 2016-03-23 NOTE — Patient Instructions (Addendum)
  Chest x-ray downstairs.   As we discussed, your symptoms are consistent with costochondritis, inflammation of the cartilage between the rib cage. You may try over-the-counter ibuprofen. As we discussed at length, the pain is reproducible and you are not having any cardiac worrisome symptoms at this time. However if any of his changes, please call 911 or notify our office as soon as possible as we would want to reevaluate you.   Costochondritis (kos-toe-kon-DRY-tis) is an inflammation of the cartilage that connects a rib to the breastbone (sternum). Pain caused by costochondritis might mimic that of a heart attack or other heart conditions. Costochondritis is sometimes known as chest wall pain, costosternal syndrome or costosternal chondrodynia. Sometimes, swelling accompanies the pain (Tietze syndrome). Costochondritis usually has no apparent cause. Treatment focuses on easing your pain while you wait for the condition to improve on its own, which can take several weeks or longer. Costochondritis usually goes away on its own, although it might last for several weeks or longer. Treatment focuses on pain relief.

## 2016-03-23 NOTE — Progress Notes (Signed)
Pre visit review using our clinic review tool, if applicable. No additional management support is needed unless otherwise documented below in the visit note. 

## 2016-08-09 ENCOUNTER — Ambulatory Visit: Payer: BLUE CROSS/BLUE SHIELD | Admitting: Family

## 2016-08-30 ENCOUNTER — Telehealth: Payer: Self-pay | Admitting: *Deleted

## 2016-08-30 NOTE — Telephone Encounter (Signed)
Left msg on triage requesting refill on his albuterol inhaler...Raechel Chute/lmb

## 2016-08-30 NOTE — Telephone Encounter (Signed)
Called pt back inform will need ov he hasn't seen Tammy SoursGreg since 2015. Made appt for 09/06/16...Raechel Chute/lmb

## 2016-09-06 ENCOUNTER — Ambulatory Visit (INDEPENDENT_AMBULATORY_CARE_PROVIDER_SITE_OTHER): Payer: BLUE CROSS/BLUE SHIELD | Admitting: Family

## 2016-09-06 ENCOUNTER — Encounter: Payer: Self-pay | Admitting: Family

## 2016-09-06 VITALS — BP 112/80 | HR 71 | Temp 98.0°F | Resp 16 | Ht 71.0 in | Wt 158.0 lb

## 2016-09-06 DIAGNOSIS — Z0000 Encounter for general adult medical examination without abnormal findings: Secondary | ICD-10-CM

## 2016-09-06 DIAGNOSIS — Z23 Encounter for immunization: Secondary | ICD-10-CM | POA: Diagnosis not present

## 2016-09-06 MED ORDER — ALBUTEROL SULFATE HFA 108 (90 BASE) MCG/ACT IN AERS: INHALATION_SPRAY | RESPIRATORY_TRACT | 2 refills | Status: AC

## 2016-09-06 NOTE — Patient Instructions (Addendum)
Thank you for choosing [facility].  SUMMARY AND INSTRUCTIONS:  Medication:  Your prescription(s) have been submitted to your pharmacy or been printed and provided for you. Please take as directed and contact our office if you believe you are having problem(s) with the medication(s) or have any questions.  Labs:  Please stop by the lab on the lower level of the building for your blood work. Your results will be released to MyChart (or called to you) after review, usually within 72 hours after test completion. If any changes need to be made, you will be notified at that same time.  1.) The lab is open from 7:30am to 5:30 pm Monday-Friday 2.) No appointment is necessary 3.) Fasting (if needed) is 6-8 hours after food and drink; black coffee and water are okay    Health Maintenance, Male A healthy lifestyle and preventative care can promote health and wellness.  Maintain regular health, dental, and eye exams.  Eat a healthy diet. Foods like vegetables, fruits, whole grains, low-fat dairy products, and lean protein foods contain the nutrients you need and are low in calories. Decrease your intake of foods high in solid fats, added sugars, and salt. Get information about a proper diet from your health care provider, if necessary.  Regular physical exercise is one of the most important things you can do for your health. Most adults should get at least 150 minutes of moderate-intensity exercise (any activity that increases your heart rate and causes you to sweat) each week. In addition, most adults need muscle-strengthening exercises on 2 or more days a week.   Maintain a healthy weight. The body mass index (BMI) is a screening tool to identify possible weight problems. It provides an estimate of body fat based on height and weight. Your health care provider can find your BMI and can help you achieve or maintain a healthy weight. For males 20 years and older:  A BMI below 18.5 is  considered underweight.  A BMI of 18.5 to 24.9 is normal.  A BMI of 25 to 29.9 is considered overweight.  A BMI of 30 and above is considered obese.  Maintain normal blood lipids and cholesterol by exercising and minimizing your intake of saturated fat. Eat a balanced diet with plenty of fruits and vegetables. Blood tests for lipids and cholesterol should begin at age 20 and be repeated every 5 years. If your lipid or cholesterol levels are high, you are over age 50, or you are at high risk for heart disease, you may need your cholesterol levels checked more frequently.Ongoing high lipid and cholesterol levels should be treated with medicines if diet and exercise are not working.  If you smoke, find out from your health care provider how to quit. If you do not use tobacco, do not start.  Lung cancer screening is recommended for adults aged 55-80 years who are at high risk for developing lung cancer because of a history of smoking. A yearly low-dose CT scan of the lungs is recommended for people who have at least a 30-pack-year history of smoking and are current smokers or have quit within the past 15 years. A pack year of smoking is smoking an average of 1 pack of cigarettes a day for 1 year (for example, a 30-pack-year history of smoking could mean smoking 1 pack a day for 30 years or 2 packs a day for 15 years). Yearly screening should continue until the smoker has stopped smoking for at least 15 years. Yearly   screening should be stopped for people who develop a health problem that would prevent them from having lung cancer treatment.  If you choose to drink alcohol, do not have more than 2 drinks per day. One drink is considered to be 12 oz (360 mL) of beer, 5 oz (150 mL) of wine, or 1.5 oz (45 mL) of liquor.  Avoid the use of street drugs. Do not share needles with anyone. Ask for help if you need support or instructions about stopping the use of drugs.  High blood pressure causes heart  disease and increases the risk of stroke. High blood pressure is more likely to develop in:  People who have blood pressure in the end of the normal range (100-139/85-89 mm Hg).  People who are overweight or obese.  People who are African American.  If you are 18-39 years of age, have your blood pressure checked every 3-5 years. If you are 40 years of age or older, have your blood pressure checked every year. You should have your blood pressure measured twice--once when you are at a hospital or clinic, and once when you are not at a hospital or clinic. Record the average of the two measurements. To check your blood pressure when you are not at a hospital or clinic, you can use:  An automated blood pressure machine at a pharmacy.  A home blood pressure monitor.  If you are 45-79 years old, ask your health care provider if you should take aspirin to prevent heart disease.  Diabetes screening involves taking a blood sample to check your fasting blood sugar level. This should be done once every 3 years after age 45 if you are at a normal weight and without risk factors for diabetes. Testing should be considered at a younger age or be carried out more frequently if you are overweight and have at least 1 risk factor for diabetes.  Colorectal cancer can be detected and often prevented. Most routine colorectal cancer screening begins at the age of 50 and continues through age 75. However, your health care provider may recommend screening at an earlier age if you have risk factors for colon cancer. On a yearly basis, your health care provider may provide home test kits to check for hidden blood in the stool. A small camera at the end of a tube may be used to directly examine the colon (sigmoidoscopy or colonoscopy) to detect the earliest forms of colorectal cancer. Talk to your health care provider about this at age 50 when routine screening begins. A direct exam of the colon should be repeated every 5-10  years through age 75, unless early forms of precancerous polyps or small growths are found.  People who are at an increased risk for hepatitis B should be screened for this virus. You are considered at high risk for hepatitis B if:  You were born in a country where hepatitis B occurs often. Talk with your health care provider about which countries are considered high risk.  Your parents were born in a high-risk country and you have not received a shot to protect against hepatitis B (hepatitis B vaccine).  You have HIV or AIDS.  You use needles to inject street drugs.  You live with, or have sex with, someone who has hepatitis B.  You are a man who has sex with other men (MSM).  You get hemodialysis treatment.  You take certain medicines for conditions like cancer, organ transplantation, and autoimmune conditions.  Hepatitis C blood   testing is recommended for all people born from 1945 through 1965 and any individual with known risk factors for hepatitis C.  Healthy men should no longer receive prostate-specific antigen (PSA) blood tests as part of routine cancer screening. Talk to your health care provider about prostate cancer screening.  Testicular cancer screening is not recommended for adolescents or adult males who have no symptoms. Screening includes self-exam, a health care provider exam, and other screening tests. Consult with your health care provider about any symptoms you have or any concerns you have about testicular cancer.  Practice safe sex. Use condoms and avoid high-risk sexual practices to reduce the spread of sexually transmitted infections (STIs).  You should be screened for STIs, including gonorrhea and chlamydia if:  You are sexually active and are younger than 24 years.  You are older than 24 years, and your health care provider tells you that you are at risk for this type of infection.  Your sexual activity has changed since you were last screened, and you are  at an increased risk for chlamydia or gonorrhea. Ask your health care provider if you are at risk.  If you are at risk of being infected with HIV, it is recommended that you take a prescription medicine daily to prevent HIV infection. This is called pre-exposure prophylaxis (PrEP). You are considered at risk if:  You are a man who has sex with other men (MSM).  You are a heterosexual man who is sexually active with multiple partners.  You take drugs by injection.  You are sexually active with a partner who has HIV.  Talk with your health care provider about whether you are at high risk of being infected with HIV. If you choose to begin PrEP, you should first be tested for HIV. You should then be tested every 3 months for as long as you are taking PrEP.  Use sunscreen. Apply sunscreen liberally and repeatedly throughout the day. You should seek shade when your shadow is shorter than you. Protect yourself by wearing long sleeves, pants, a wide-brimmed hat, and sunglasses year round whenever you are outdoors.  Tell your health care provider of new moles or changes in moles, especially if there is a change in shape or color. Also, tell your health care provider if a mole is larger than the size of a pencil eraser.  A one-time screening for abdominal aortic aneurysm (AAA) and surgical repair of large AAAs by ultrasound is recommended for men aged 65-75 years who are current or former smokers.  Stay current with your vaccines (immunizations).   This information is not intended to replace advice given to you by your health care provider. Make sure you discuss any questions you have with your health care provider.   Document Released: 05/12/2008 Document Revised: 12/05/2014 Document Reviewed: 04/11/2011 Elsevier Interactive Patient Education 2016 Elsevier Inc.  

## 2016-09-06 NOTE — Assessment & Plan Note (Addendum)
1) Anticipatory Guidance: Discussed importance of wearing a seatbelt while driving and not texting while driving; changing batteries in smoke detector at least once annually; wearing suntan lotion when outside; eating a balanced and moderate diet; getting physical activity at least 30 minutes per day.  2) Immunizations / Screenings / Labs:  Influenza updated today. All other immunizations are up-to-date per recommendations. Due for dental screen encouraged to be completed independently. All other screenings are up-to-date per recommendations. Obtain CBC, CMET, and lipid profile.    Overall well exam with risk factors for cardiovascular disease being minimal at this time. Encouraged to decrease processed/fast food intake. Continue exercise. Continue other healthy lifestyle behaviors and choices. Follow-up prevention exam in 1 year. Follow-up office visit pending blood work as needed.

## 2016-09-06 NOTE — Progress Notes (Signed)
Subjective:    Patient ID: Bruce Foster, male    DOB: 06/28/1991, 25 y.o.   MRN: [id number]  Chief Complaint  Patient presents with  . CPE    not fasting    HPI:  Bruce Foster is a 25 y.o. male who presents today for an annual wellness visit.   1) Health Maintenance -   Diet - Averages about 3-4 meals per day consisting of a regular diet; Caffeine intake of about 3-4 cups per day; Daily fast/processed foods  Exercise - 3-4 times per week consisting of resistance and cardio.   2) Preventative Exams / Immunizations:  Dental -- Due for exam  Vision -- Up to date   Health Maintenance  Topic Date Due  . HIV Screening  11/29/2005  . INFLUENZA VACCINE  06/28/2016  . TETANUS/TDAP  01/16/2026    Immunization History  Administered Date(s) Administered  . Influenza,inj,Quad PF,36+ Mos 09/06/2016  . Tdap 01/17/2016     Allergies  Allergen Reactions  . Cephalosporins   . Penicillins      Outpatient Medications Prior to Visit  Medication Sig Dispense Refill  . EPINEPHrine 0.3 mg/0.3 mL IJ SOAJ injection Inject 0.3 mLs (0.3 mg total) into the muscle once. 1 Device 0  . albuterol (VENTOLIN HFA) 108 (90 BASE) MCG/ACT inhaler 2 PUFFS EVERY 4 HOURS IF NEEDED FOR ASTHMA. 8.5 each 0   No facility-administered medications prior to visit.      Past Medical History:  Diagnosis Date  . Asthma      Past Surgical History:  Procedure Laterality Date  . Double jaw surgery  2010     Family History  Problem Relation Age of Onset  . Heart disease Mother   . Healthy Father   . Healthy Paternal Grandmother   . Healthy Paternal Grandfather      Social History   Social History  . Marital status: Single    Spouse name: N/A  . Number of children: 0  . Years of education: 21   Occupational History  . Procurement Coordinator    Social History Main Topics  . Smoking status: Former Smoker    Types: Cigars  . Smokeless tobacco: Never Used  . Alcohol use 12.6  oz/week    21 Cans of beer per week  . Drug use: No  . Sexual activity: Not on file   Other Topics Concern  . Not on file   Social History Narrative   Born and raised in [place] and then raised mainly in [place]. Currently lives in an apartment by himself. No pets. Fun: Basketball, running, mountain biking   Denies religious beliefs effecting health care.       Review of Systems  Constitutional: Denies fever, chills, fatigue, or significant weight gain/loss. HENT: Head: Denies headache or neck pain Ears: Denies changes in hearing, ringing in ears, earache, drainage Nose: Denies discharge, stuffiness, itching, nosebleed, sinus pain Throat: Denies sore throat, hoarseness, dry mouth, sores, thrush Eyes: Denies loss/changes in vision, pain, redness, blurry/double vision, flashing lights Cardiovascular: Denies chest pain/discomfort, tightness, palpitations, shortness of breath with activity, difficulty lying down, swelling, sudden awakening with shortness of breath Respiratory: Denies shortness of breath, cough, sputum production, wheezing Gastrointestinal: Denies dysphasia, heartburn, change in appetite, nausea, change in bowel habits, rectal bleeding, constipation, diarrhea, yellow skin or eyes Genitourinary: Denies frequency, urgency, burning/pain, blood in urine, incontinence, change in urinary strength. Musculoskeletal: Denies muscle/joint pain, stiffness, back pain, redness or swelling of joints, trauma Skin: Denies  rashes, lumps, itching, dryness, color changes, or hair/nail changes Neurological: Denies dizziness, fainting, seizures, weakness, numbness, tingling, tremor Psychiatric - Denies nervousness, stress, depression or memory loss Endocrine: Denies heat or cold intolerance, sweating, frequent urination, excessive thirst, changes in appetite Hematologic: Denies ease of bruising or bleeding     Objective:     BP 112/80 (BP Location: Left Arm, Patient Position: Sitting, Cuff  Size: Normal)   Pulse 71   Temp 98 F (36.7 C) (Oral)   Resp 16   Ht 5\' 11"  (1.803 m)   Wt 158 lb (71.7 kg)   SpO2 97%   BMI 22.04 kg/m  Nursing note and vital signs reviewed.  Physical Exam  Constitutional: He is oriented to person, place, and time. He appears well-developed and well-nourished.  HENT:  Head: Normocephalic.  Right Ear: Hearing, tympanic membrane, external ear and ear canal normal.  Left Ear: Hearing, tympanic membrane, external ear and ear canal normal.  Nose: Nose normal.  Mouth/Throat: Uvula is midline, oropharynx is clear and moist and mucous membranes are normal.  Eyes: Conjunctivae and EOM are normal. Pupils are equal, round, and reactive to light.  Neck: Neck supple. No JVD present. No tracheal deviation present. No thyromegaly present.  Cardiovascular: Normal rate, regular rhythm, normal heart sounds and intact distal pulses.   Pulmonary/Chest: Effort normal and breath sounds normal.  Abdominal: Soft. Bowel sounds are normal. He exhibits no distension and no mass. There is no tenderness. There is no rebound and no guarding.  Musculoskeletal: Normal range of motion. He exhibits no edema or tenderness.  Lymphadenopathy:    He has no cervical adenopathy.  Neurological: He is alert and oriented to person, place, and time. He has normal reflexes. No cranial nerve deficit. He exhibits normal muscle tone. Coordination normal.  Skin: Skin is warm and dry.  Psychiatric: He has a normal mood and affect. His behavior is normal. Judgment and thought content normal.       Assessment & Plan:   Problem List Items Addressed This Visit      Other   Routine general medical examination at a health care facility - Primary    1) Anticipatory Guidance: Discussed importance of wearing a seatbelt while driving and not texting while driving; changing batteries in smoke detector at least once annually; wearing suntan lotion when outside; eating a balanced and moderate diet;  getting physical activity at least 30 minutes per day.  2) Immunizations / Screenings / Labs:  Influenza updated today. All other immunizations are up-to-date per recommendations. Due for dental screen encouraged to be completed independently. All other screenings are up-to-date per recommendations. Obtain CBC, CMET, and lipid profile.    Overall well exam with risk factors for cardiovascular disease being minimal at this time. Encouraged to decrease processed/fast food intake. Continue exercise. Continue other healthy lifestyle behaviors and choices. Follow-up prevention exam in 1 year. Follow-up office visit pending blood work as needed.       Relevant Medications   albuterol (VENTOLIN HFA) 108 (90 Base) MCG/ACT inhaler   Other Relevant Orders   CBC   Comprehensive metabolic panel   Lipid panel    Other Visit Diagnoses    Encounter for immunization       Relevant Orders   Flu Vaccine QUAD 36+ mos IM (Completed)       I am having Mr. Ninfa Meekerlam maintain his EPINEPHrine and albuterol.   Meds ordered this encounter  Medications  . albuterol (VENTOLIN HFA) 108 (90 Base) MCG/ACT  inhaler    Sig: 2 PUFFS EVERY 4 HOURS IF NEEDED FOR ASTHMA.    Dispense:  18 each    Refill:  2     Follow-up: Return in about 6 months (around 03/07/2017), or if symptoms worsen or fail to improve.   Jeanine Luz, FNP

## 2016-09-30 ENCOUNTER — Ambulatory Visit (INDEPENDENT_AMBULATORY_CARE_PROVIDER_SITE_OTHER): Payer: BLUE CROSS/BLUE SHIELD | Admitting: Internal Medicine

## 2016-09-30 DIAGNOSIS — Z23 Encounter for immunization: Secondary | ICD-10-CM | POA: Diagnosis not present

## 2016-09-30 DIAGNOSIS — Z7189 Other specified counseling: Secondary | ICD-10-CM

## 2016-09-30 DIAGNOSIS — Z7184 Encounter for health counseling related to travel: Secondary | ICD-10-CM

## 2016-09-30 MED ORDER — TYPHOID VACCINE PO CPDR: 1.0000 | DELAYED_RELEASE_CAPSULE | ORAL | 0 refills | Status: AC

## 2016-09-30 MED ORDER — ATOVAQUONE-PROGUANIL HCL 250-100 MG PO TABS: 1.0000 | ORAL_TABLET | Freq: Every day | ORAL | 0 refills | Status: AC

## 2016-09-30 MED ORDER — AZITHROMYCIN 500 MG PO TABS: 500.0000 mg | ORAL_TABLET | Freq: Every day | ORAL | 0 refills | Status: AC

## 2016-09-30 NOTE — Patient Instructions (Signed)
[facility]                [address], [address]                   [place], [place] [id number]                      Phone: [phone]                        Fax: [phone]   Planned departure date: 11/20         Planned return date: 11/26 Countries of travel: [place]  Guidelines for the Prevention & Treatment of Traveler's Diarrhea  Prevention: "Boil it, Peel it, Cook it, or Forget it"   the fewer chances -> lower risk: try to stick to food & water precautions as much as possible"   If it's "piping hot"; it is probably okay, if not, it may not be   Treatment   1) You should always take care to drink lots of fluids in order to avoid dehydration   2) You should bring medications with you in case you come down with a case of diarrhea   3) OTC = bring pepto-bismol - can take with initial abdominal symptoms;                    Imodium - can help slow down your intestinal tract, can help relief cramps                    and diarrhea, can take if no bloody diarrhea  Use azithromycin if needed for traveler's diarrhea  Guidelines for the Prevention of Malaria  Avoidance:  -fewer mosquito bites = lower risk. Mosquitos can bite at night as well as daytime  -cover up (long sleeve clothing), mosquito nets, screens  -Insect repellent for your skin ( DEET containing lotion > 20%): for clothes ( permethrin spray)   On Sunday Nov 19th start malarone for malaria prevention.   Immunizations received today: hepatitis A and start oral typhoid vaccine (take 1 tab every other day x 4 doses)   Prior to travel:  1) Be sure to pick up appropriate prescriptions, including medicine you take daily. Do not expect to be able to fill your prescriptions abroad.  2) Strongly consider obtaining traveler's insurance, including emergency evacuation insurance. Most plans in the [place] do not cover participants abroad. (see below for resources)  3) Register  at the appropriate [place] embassy or consulate with travel dates so they are aware of your presence in-country and for helpful advice during travel using the [employer] Traveler Enrollment Program (STEP, GuyGalaxy.sihttps://step.state.gov/step).  4) Leave contact information with a relative or friend.  5) Keep a Corporate treasurerphotocopy passport, credit cards in case they become lost or stolen  6) Inform your credit card company that you will be travelling abroad   During travel:  1) If you become ill and need medical advice, the [place] WellPointembassy website of the country you are traveling in general provides a list of English speaking doctors.  We are also available on MyChart for remote consultation if you register prior to travel. 2) Avoid motorcycles or scooters when at all possible. Traffic laws in many countries are lax and accidents occur frequently.  3) Do not take any unnecessary risks that you wouldn't do at home.   Resources:  -  Country specific information: http://www.church.org/www.cdc.gov/travel or GuyGalaxy.sihttps://step.state.gov/step  -Chief Strategy OfficerTravel Supplies- recommend DEET mosquito spray and premethrin for clothing: [employer], [employer] store, [employer], [employer]  -Travel insurance options: gatewayplans.com; FixItGel.esmedexassist.com; travelguard.com or [employer] Insurance, gninsurance.com or info@gninsurance .com, 973-496-1772270-199-2291.   Post Travel:  If you return from your trip ill, call your primary care doctor or our travel clinic @ [phone].   Enjoy your trip and know that with proper pre-travel preparation, most people have an enjoyable and uninterrupted trip!

## 2016-09-30 NOTE — Progress Notes (Signed)
Subjective:   Bruce Foster is a 25 y.o. male who presents to the Infectious Disease clinic for travel consultation. Going to [place], likely going to islands and possibly [place] Planned departure date: 11/20         Planned return date: 11/26 Countries of travel: [place] Areas in country: both rural and urban Accommodations: air bnb or hotels Purpose of travel: vacation Prior travel out of [place]: yes, previously traveled to [place], [place]  Pmhx: asthma All:PCN, cephalosporins Meds: prn inhaler     Assessment:    No contraindications to travel. none     Plan:    pre travel counseling = provided recs to get hep A vaccine (previously had 1 dose as a child) Will also get vivotif. He is uptodate on childhood vaccines   Traveler's diarrhea = gave rx for azithromycin  Mosquito bite prevention/malaria proph = gave rx for malarone to start on 11/19, will start trip in [place] which is free of malaria.  Gave general precautions to not pet animals or risk adverse activities, riding motorcycles

## 2016-11-16 ENCOUNTER — Other Ambulatory Visit (INDEPENDENT_AMBULATORY_CARE_PROVIDER_SITE_OTHER): Payer: BLUE CROSS/BLUE SHIELD

## 2016-11-16 DIAGNOSIS — Z0000 Encounter for general adult medical examination without abnormal findings: Secondary | ICD-10-CM

## 2016-11-16 LAB — LIPID PANEL
CHOLESTEROL: 156 mg/dL (ref 0–200)
HDL: 49.2 mg/dL (ref >39.00)
LDL Cholesterol: 96 mg/dL (ref 0–99)
NonHDL: 106.41
TRIGLYCERIDES: 51 mg/dL (ref 0.0–149.0)
Total CHOL/HDL Ratio: 3
VLDL: 10.2 mg/dL (ref 0.0–40.0)

## 2016-11-16 LAB — CBC
HEMATOCRIT: 42.2 % (ref 39.0–52.0)
HEMOGLOBIN: 14.7 g/dL (ref 13.0–17.0)
MCHC: 34.8 g/dL (ref 30.0–36.0)
MCV: 92.4 fl (ref 78.0–100.0)
Platelets: 203 10*3/uL (ref 150.0–400.0)
RBC: 4.57 Mil/uL (ref 4.22–5.81)
RDW: 13.7 % (ref 11.5–15.5)
WBC: 6.5 10*3/uL (ref 4.0–10.5)

## 2016-11-16 LAB — COMPREHENSIVE METABOLIC PANEL
ALBUMIN: 4.2 g/dL (ref 3.5–5.2)
ALK PHOS: 81 U/L (ref 39–117)
ALT: 14 U/L (ref 0–53)
AST: 18 U/L (ref 0–37)
BUN: 19 mg/dL (ref 6–23)
CALCIUM: 8.8 mg/dL (ref 8.4–10.5)
CO2: 29 mEq/L (ref 19–32)
Chloride: 105 mEq/L (ref 96–112)
Creatinine, Ser: 1.11 mg/dL (ref 0.40–1.50)
GFR: 85.13 mL/min (ref >60.00)
GLUCOSE: 82 mg/dL (ref 70–99)
POTASSIUM: 3.8 meq/L (ref 3.5–5.1)
Sodium: 142 mEq/L (ref 135–145)
TOTAL PROTEIN: 6.9 g/dL (ref 6.0–8.3)
Total Bilirubin: 0.6 mg/dL (ref 0.2–1.2)

## 2017-12-01 IMAGING — DX DG CHEST 2V
2 series · 2 of 2 positions shown · non-contrast
Comparison: 04/28/2010

CLINICAL DATA: Mid to lower sternal chest pain for 1 week.  Asthma.

EXAM:
CHEST  2 VIEW

[chest pa]
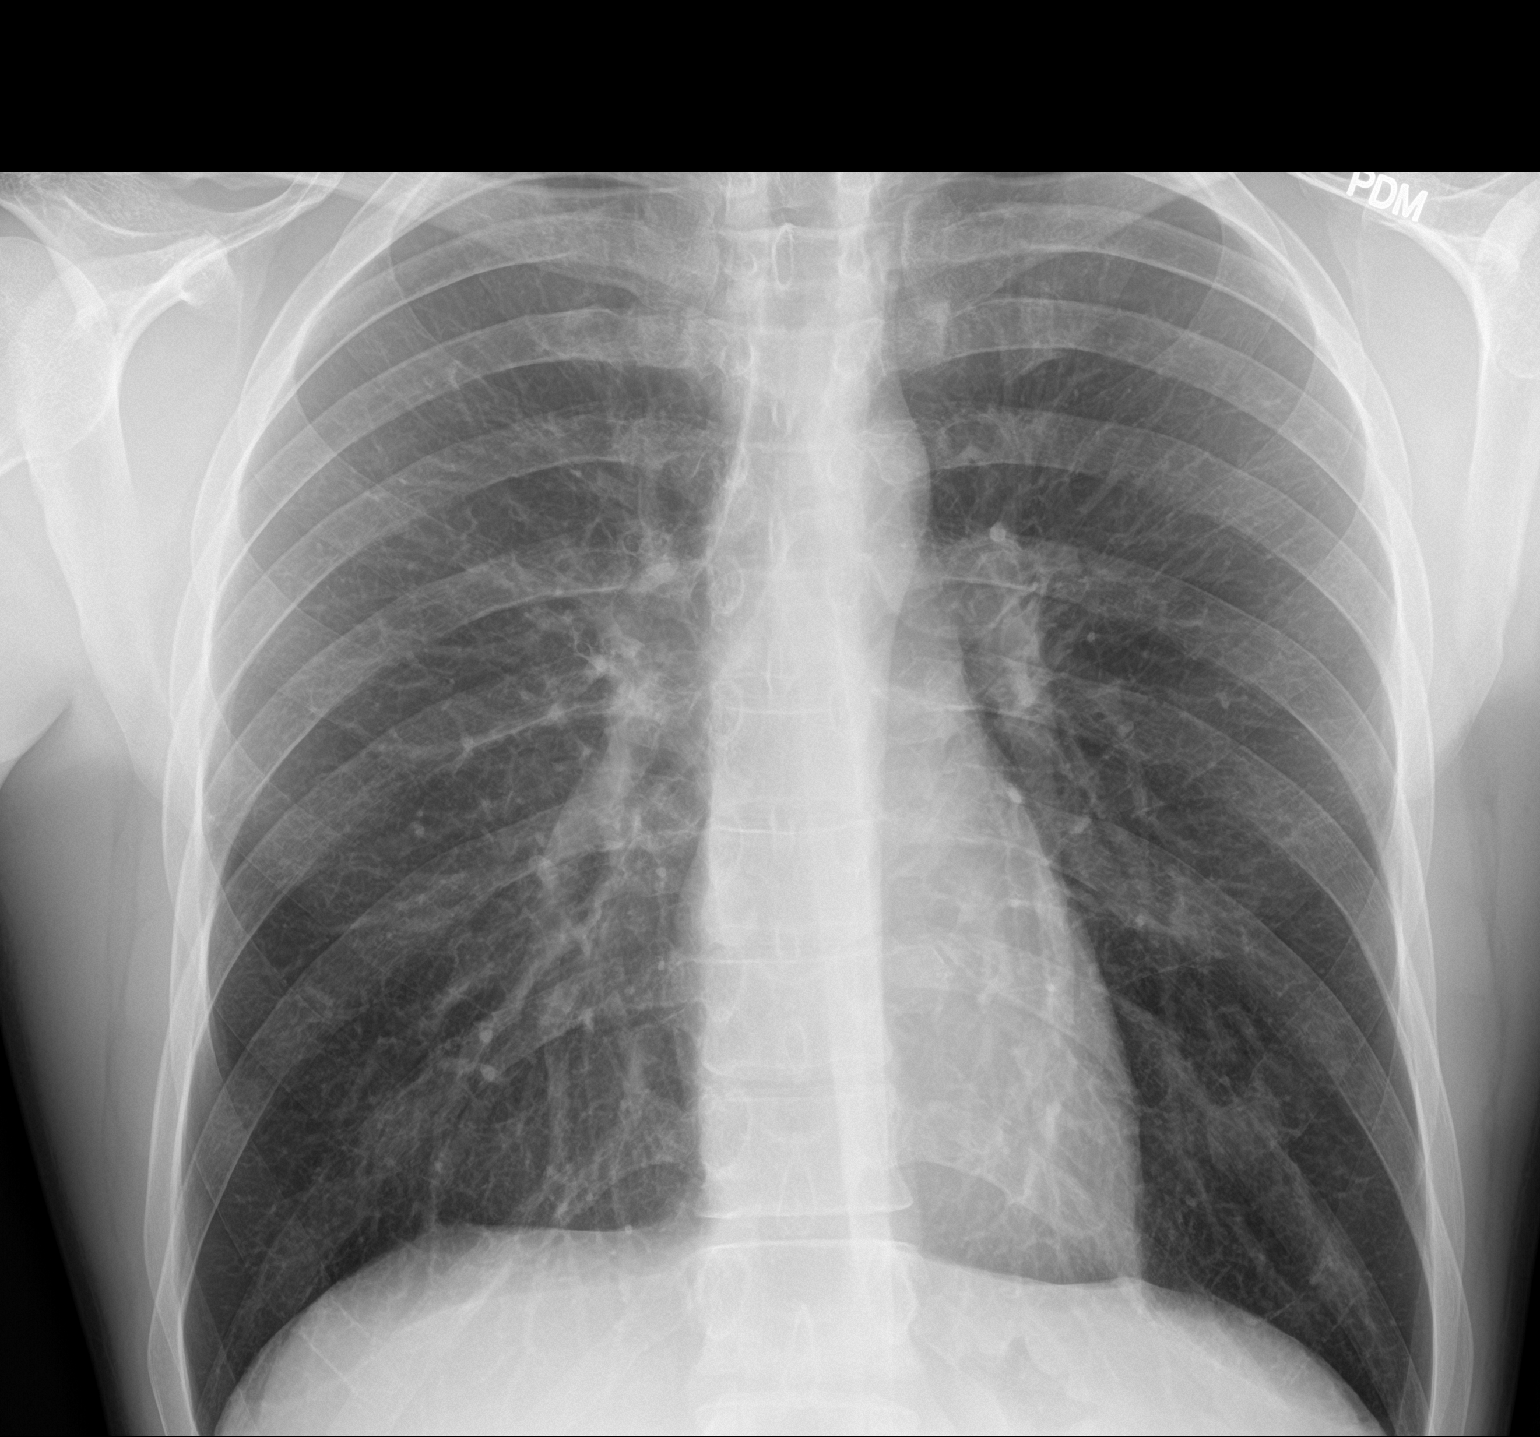

[chest lat]
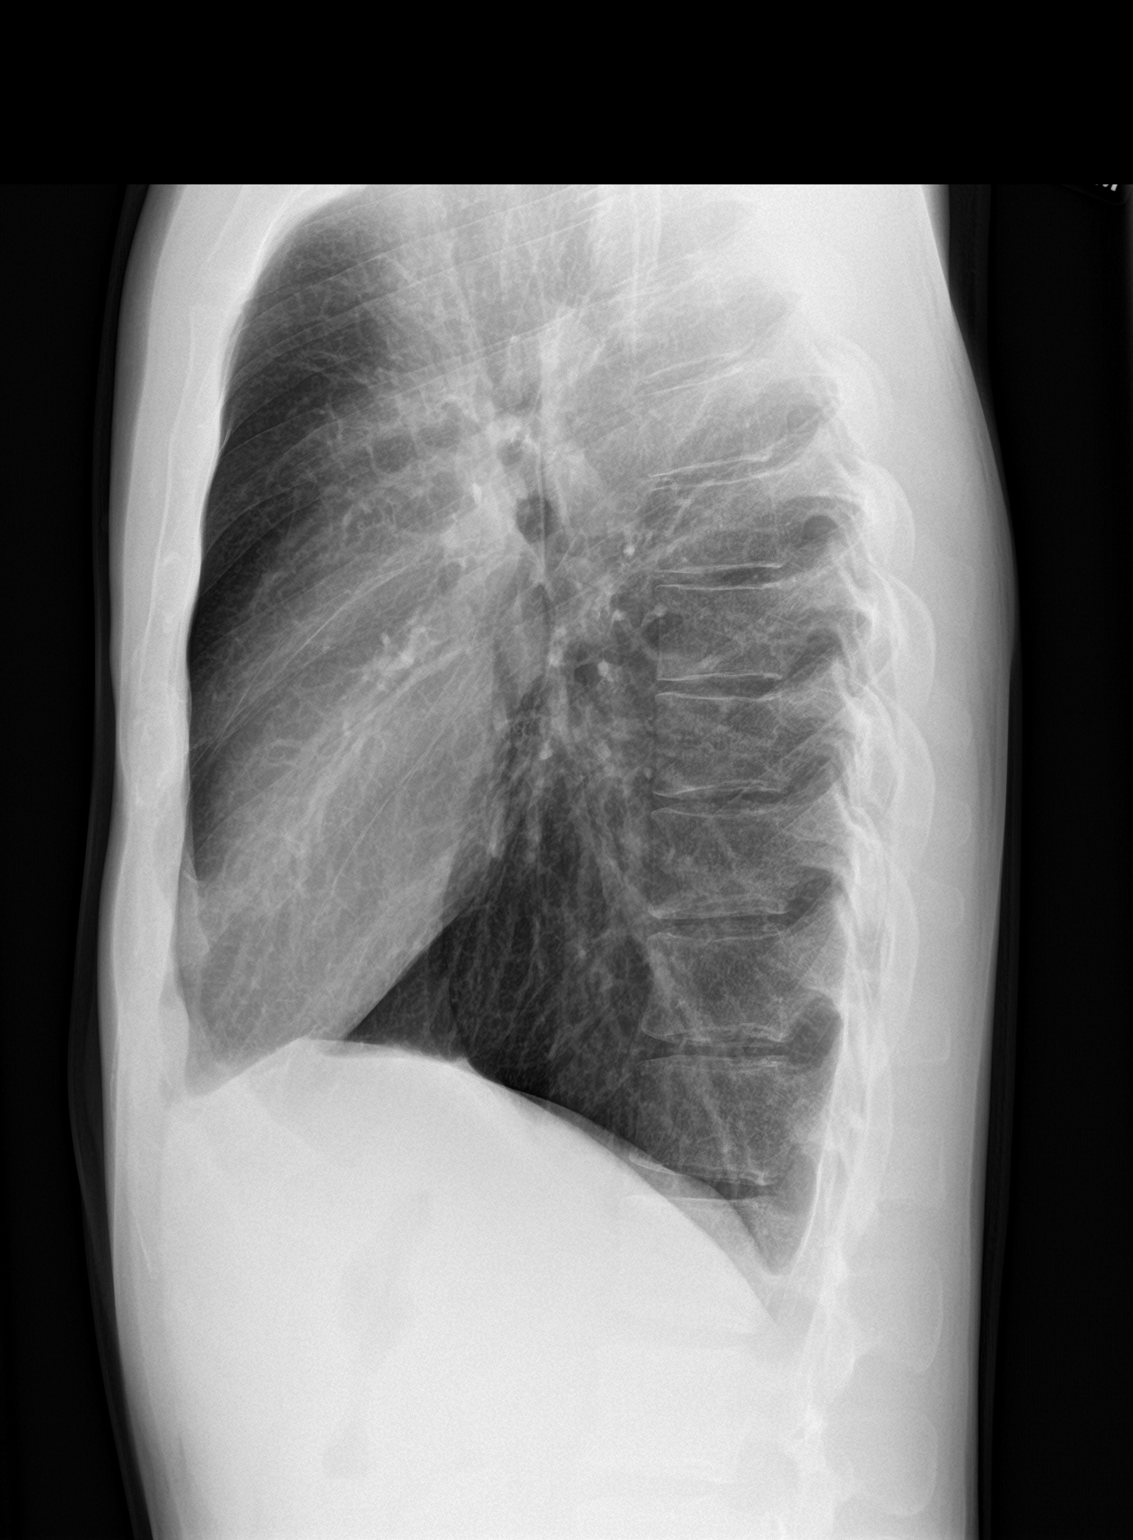

[2 of 2 positions shown; findings below may reference images not displayed]

FINDINGS: The heart size and mediastinal contours are within normal limits.
Both lungs are clear. The visualized skeletal structures are
unremarkable.
IMPRESSION: No active cardiopulmonary disease.
